# Patient Record
Sex: Female | Born: 1965 | Race: White | Hispanic: No | Marital: Single | Smoking: Never smoker
Health system: Southern US, Community
[De-identification: ages and names within clinical notes are randomized; demographics above are authoritative.]

## PROBLEM LIST (undated history)

## (undated) DIAGNOSIS — D759 Disease of blood and blood-forming organs, unspecified: Secondary | ICD-10-CM

## (undated) DIAGNOSIS — H5440 Blindness, one eye, unspecified eye: Secondary | ICD-10-CM

## (undated) DIAGNOSIS — R32 Unspecified urinary incontinence: Secondary | ICD-10-CM

## (undated) DIAGNOSIS — F319 Bipolar disorder, unspecified: Secondary | ICD-10-CM

## (undated) DIAGNOSIS — F99 Mental disorder, not otherwise specified: Secondary | ICD-10-CM

## (undated) HISTORY — PX: ABDOMINAL HYSTERECTOMY: SHX81

---

## 1999-02-11 ENCOUNTER — Ambulatory Visit (HOSPITAL_COMMUNITY): Admission: RE | Admit: 1999-02-11 | Discharge: 1999-02-11 | Payer: Self-pay | Admitting: Obstetrics and Gynecology

## 2001-05-17 ENCOUNTER — Ambulatory Visit (HOSPITAL_COMMUNITY): Admission: RE | Admit: 2001-05-17 | Discharge: 2001-05-17 | Payer: Self-pay | Admitting: Obstetrics and Gynecology

## 2003-05-22 ENCOUNTER — Ambulatory Visit (HOSPITAL_COMMUNITY): Admission: RE | Admit: 2003-05-22 | Discharge: 2003-05-22 | Payer: Self-pay | Admitting: Obstetrics and Gynecology

## 2004-07-14 ENCOUNTER — Emergency Department (HOSPITAL_COMMUNITY): Admission: EM | Admit: 2004-07-14 | Discharge: 2004-07-14 | Payer: Self-pay | Admitting: Emergency Medicine

## 2010-10-27 ENCOUNTER — Other Ambulatory Visit (HOSPITAL_COMMUNITY): Payer: Medicaid Other

## 2010-10-28 ENCOUNTER — Encounter (HOSPITAL_COMMUNITY)
Admission: RE | Admit: 2010-10-28 | Discharge: 2010-10-28 | Disposition: A | Discharge status: Home / Self Care | Payer: Medicaid Other | Source: Ambulatory Visit | Attending: Dentistry | Admitting: Dentistry

## 2010-10-28 LAB — CBC
MCH: 32 pg (ref 26.0–34.0)
MCHC: 35.1 g/dL (ref 30.0–36.0)
Platelets: 98 10*3/uL — ABNORMAL LOW (ref 150–400)

## 2010-10-28 LAB — BASIC METABOLIC PANEL
CO2: 31 mEq/L (ref 19–32)
Calcium: 9.4 mg/dL (ref 8.4–10.5)
Creatinine, Ser: 0.64 mg/dL (ref 0.4–1.2)
GFR calc Af Amer: >60 mL/min (ref >60)

## 2010-11-01 ENCOUNTER — Ambulatory Visit (HOSPITAL_COMMUNITY)
Admission: RE | Admit: 2010-11-01 | Discharge: 2010-11-01 | Disposition: A | Discharge status: Home / Self Care | Payer: Medicaid Other | Source: Ambulatory Visit | Attending: Dentistry | Admitting: Dentistry

## 2010-11-01 DIAGNOSIS — Z01812 Encounter for preprocedural laboratory examination: Secondary | ICD-10-CM | POA: Insufficient documentation

## 2010-11-01 DIAGNOSIS — K029 Dental caries, unspecified: Secondary | ICD-10-CM | POA: Insufficient documentation

## 2010-11-17 NOTE — Op Note (Signed)
  Mackenzie Mcdonald, Mackenzie Mcdonald NO.:  [id number]  MEDICAL RECORD NO.:  [id number]  LOCATION:                                 FACILITY:  PHYSICIAN:  Esaw Dace., D.D.S.DATE OF BIRTH:  Oct 17, 1965  DATE OF PROCEDURE: DATE OF DISCHARGE:                              OPERATIVE REPORT   PREOPERATIVE DIAGNOSIS:  Dental caries with behavior management due to intellectual and physical disability.  OPERATION:  Oral rehabilitation including exam, cleaning, and operative.  SURGEON:  Azucena Freed, DDS  ASSISTANT:  Tobi Bastos, DA, and hospital staff.  ANESTHESIA:  General.  Dental care provided in the OR for medically necessary treatment findings for soft tissue including the floor of the mouth, buccal mucosa, soft palate, hard palate, tongue, gingiva, and frenum attachments all within normal limits with regard to size, color, and consistency.  Hard tissue exam reveals missing #1 and 2 with 3 through 8 present, missing #9 and 10, 11 present, 12 missing 13 present, 14 missing, 15 present, 16 missing, 17 and 18 missing, 19 through 28 present, 29 missing, 30 through 32 present.  PROCEDURE:  The patient was brought into the operating room and placed in the supine position.  General anesthesia was administered via nasal intubation.  The patient was prepped and draped in the usual manner for an intraoral general dentistry procedure.  The oropharynx was suctioned and a moistened posterior throat pack was placed.  A full intraoral exam including all hard and soft tissues was performed.  Full mouth series of digital x-rays was taken.  Scaling was completed on the lower right, upper left, and lower left.  Extractions were done on #8, 30, and 32 using 3-0 chromic suture with Gelfoam.  A 6 mL of 2% lidocaine anesthesia was given with a 1:100,000 epinephrine.  The mouth was suctioned dry and posterior throat pack was carefully removed with constant suction.  The estimated  blood loss was 30 mL.  The patient was awakened in the OR and transferred to the recovery room in good condition.  Postoperative prescriptions given, Vicodin 5 mg/325 mg 8 tablets one q.4 hours p.r.n. pain, then amoxicillin 500 mg one t.i.d. x7 days for postage extraction infection.     Esaw Dace., D.D.S.     WEM/MEDQ  D:  11/10/2010  T:  11/10/2010  Job:  [id number]  Electronically Signed by Boneta Lucks D.D.S. on 11/17/2010 08:09:57 AM

## 2011-10-05 ENCOUNTER — Other Ambulatory Visit: Payer: Self-pay | Admitting: Family Medicine

## 2011-10-05 DIAGNOSIS — Z1231 Encounter for screening mammogram for malignant neoplasm of breast: Secondary | ICD-10-CM

## 2011-10-13 ENCOUNTER — Ambulatory Visit: Payer: Medicaid Other

## 2013-03-19 ENCOUNTER — Other Ambulatory Visit: Payer: Self-pay | Admitting: Dentistry

## 2013-03-24 ENCOUNTER — Encounter (HOSPITAL_COMMUNITY): Payer: Self-pay | Admitting: Pharmacy Technician

## 2013-03-25 NOTE — Pre-Procedure Instructions (Signed)
Mackenzie Mcdonald  03/25/2013   Your procedure is scheduled on:  Tuesday, October 28th  Report to Main Entrance "A" and check in with admitting at 1100 AM.  Call this number if you have problems the morning of surgery: [phone]   Remember:   Do not eat food or drink liquids after midnight.   Take these medicines the morning of surgery with A SIP OF WATER: lexapro, zyprexa, topamax   Do not wear jewelry, make-up or nail polish.  Do not wear lotions, powders, or perfumes. You may wear deodorant.  Do not shave 48 hours prior to surgery. Men may shave face and neck.  Do not bring valuables to the hospital.  [facility] is not responsible  for any belongings or valuables.               Contacts, dentures or bridgework may not be worn into surgery.  Leave suitcase in the car. After surgery it may be brought to your room.  For patients admitted to the hospital, discharge time is determined by your treatment team.               Patients discharged the day of surgery will not be allowed to drive home.    Special Instructions: Shower using CHG 2 nights before surgery and the night before surgery.  If you shower the day of surgery use CHG.  Use special wash - you have one bottle of CHG for all showers.  You should use approximately 1/3 of the bottle for each shower.   Please read over the following fact sheets that you were given: Pain Booklet, Coughing and Deep Breathing and Surgical Site Infection Prevention

## 2013-03-26 ENCOUNTER — Encounter (HOSPITAL_COMMUNITY)
Admission: RE | Admit: 2013-03-26 | Discharge: 2013-03-26 | Disposition: A | Discharge status: Home / Self Care | Payer: Medicaid Other | Source: Ambulatory Visit | Attending: Dentistry | Admitting: Dentistry

## 2013-03-26 ENCOUNTER — Encounter (HOSPITAL_COMMUNITY): Payer: Self-pay

## 2013-03-26 DIAGNOSIS — Z01818 Encounter for other preprocedural examination: Secondary | ICD-10-CM | POA: Insufficient documentation

## 2013-03-26 DIAGNOSIS — Z01812 Encounter for preprocedural laboratory examination: Secondary | ICD-10-CM | POA: Insufficient documentation

## 2013-03-26 HISTORY — DX: Disease of blood and blood-forming organs, unspecified: D75.9

## 2013-03-26 HISTORY — DX: Blindness, one eye, unspecified eye: H54.40

## 2013-03-26 HISTORY — DX: Mental disorder, not otherwise specified: F99

## 2013-03-26 HISTORY — DX: Unspecified urinary incontinence: R32

## 2013-03-26 HISTORY — DX: Bipolar disorder, unspecified: F31.9

## 2013-03-26 LAB — CBC
MCH: 30.6 pg (ref 26.0–34.0)
Platelets: 131 10*3/uL — ABNORMAL LOW (ref 150–400)
RBC: 4.54 MIL/uL (ref 3.87–5.11)
RDW: 12.3 % (ref 11.5–15.5)
WBC: 5.8 10*3/uL (ref 4.0–10.5)

## 2013-03-26 NOTE — Progress Notes (Signed)
Guardianship papers received and placed in chart. Consent faxed back to Genia Harold to be witnessed.

## 2013-03-26 NOTE — Progress Notes (Signed)
Guardianship papers to be faxed by R. Roxan Hockey [phone].  Consent on chart signed by legal guardian.

## 2013-03-27 NOTE — Progress Notes (Signed)
Anesthesia Chart Review:  Patient is a 47 year old female scheduled for xrays, dental restoration, extractions on 04/01/13 by Dr. Martie Round.  History includes Bipolar 1 disorder, autism, blindness in left eye, urinary incontinence, thrombocytopenia, hysterectomy, prior dental surgery 11/01/10 [facility]).  Her guardianship is with the [facility] DSS.  Preoperative CBC showed a PLT count of 131K, H/H WNL.  H&P and guardianship papers are in her chart.  Anticipate that she can proceed as planned.  Velna Ochs [facility] Short Stay Center/Anesthesiology Phone [phone] 03/27/2013 12:09 PM

## 2013-03-27 NOTE — Progress Notes (Signed)
Left message for Gerda Diss at [facility] for clarification on the signature  For the consent of surgery  Compared to the state of [employer] [employer] [place] [employer].

## 2013-04-01 ENCOUNTER — Encounter (HOSPITAL_COMMUNITY): Payer: Self-pay | Admitting: Anesthesiology

## 2013-04-01 ENCOUNTER — Ambulatory Visit (HOSPITAL_COMMUNITY): Payer: Medicaid Other | Admitting: Anesthesiology

## 2013-04-01 ENCOUNTER — Encounter (HOSPITAL_COMMUNITY): Payer: Medicaid Other | Admitting: Vascular Surgery

## 2013-04-01 ENCOUNTER — Encounter (HOSPITAL_COMMUNITY)
Admission: RE | Disposition: A | Discharge status: Nursing Home (Includes ICF) | Payer: Self-pay | Source: Ambulatory Visit | Attending: Dentistry

## 2013-04-01 ENCOUNTER — Ambulatory Visit (HOSPITAL_COMMUNITY)
Admission: RE | Admit: 2013-04-01 | Discharge: 2013-04-01 | Disposition: A | Discharge status: Other Acute Inpatient Hospital | Payer: Medicaid Other | Source: Ambulatory Visit | Attending: Dentistry | Admitting: Dentistry

## 2013-04-01 DIAGNOSIS — D696 Thrombocytopenia, unspecified: Secondary | ICD-10-CM | POA: Insufficient documentation

## 2013-04-01 DIAGNOSIS — K029 Dental caries, unspecified: Secondary | ICD-10-CM | POA: Insufficient documentation

## 2013-04-01 DIAGNOSIS — F79 Unspecified intellectual disabilities: Secondary | ICD-10-CM | POA: Insufficient documentation

## 2013-04-01 DIAGNOSIS — Z79899 Other long term (current) drug therapy: Secondary | ICD-10-CM | POA: Insufficient documentation

## 2013-04-01 DIAGNOSIS — F840 Autistic disorder: Secondary | ICD-10-CM | POA: Insufficient documentation

## 2013-04-01 DIAGNOSIS — K0381 Cracked tooth: Secondary | ICD-10-CM | POA: Insufficient documentation

## 2013-04-01 HISTORY — PX: DENTAL RESTORATION/EXTRACTION WITH X-RAY: SHX5796

## 2013-04-01 SURGERY — DENTAL RESTORATION/EXTRACTION WITH X-RAY
Anesthesia: General | Site: Mouth | Wound class: Clean Contaminated

## 2013-04-01 MED ORDER — GLYCOPYRROLATE 0.2 MG/ML IJ SOLN
INTRAMUSCULAR | Status: DC | PRN
  Administered 2013-04-01: 0.2 mg via INTRAVENOUS

## 2013-04-01 MED ORDER — FENTANYL CITRATE 0.05 MG/ML IJ SOLN
INTRAMUSCULAR | Status: DC | PRN
  Administered 2013-04-01: 100 ug via INTRAVENOUS
  Administered 2013-04-01 (×3): 50 ug via INTRAVENOUS

## 2013-04-01 MED ORDER — LACTATED RINGERS IV SOLN
INTRAVENOUS | Status: DC | PRN
  Administered 2013-04-01: 12:00:00 via INTRAVENOUS

## 2013-04-01 MED ORDER — LACTATED RINGERS IV SOLN
INTRAVENOUS | Status: DC
  Administered 2013-04-01: 11:00:00 via INTRAVENOUS

## 2013-04-01 MED ORDER — PROPOFOL 10 MG/ML IV BOLUS
INTRAVENOUS | Status: DC | PRN
  Administered 2013-04-01: 170 mg via INTRAVENOUS

## 2013-04-01 MED ORDER — ONDANSETRON HCL 4 MG/2ML IJ SOLN
INTRAMUSCULAR | Status: DC | PRN
  Administered 2013-04-01: 4 mg via INTRAVENOUS

## 2013-04-01 MED ORDER — LIDOCAINE HCL (CARDIAC) 20 MG/ML IV SOLN
INTRAVENOUS | Status: DC | PRN
  Administered 2013-04-01: 60 mg via INTRAVENOUS

## 2013-04-01 MED ORDER — LIDOCAINE HCL 2 % IJ SOLN
INTRAMUSCULAR | Status: DC | PRN
  Administered 2013-04-01: 20 mL

## 2013-04-01 MED ORDER — SUCCINYLCHOLINE CHLORIDE 20 MG/ML IJ SOLN
INTRAMUSCULAR | Status: DC | PRN
  Administered 2013-04-01: 100 mg via INTRAVENOUS

## 2013-04-01 MED ORDER — KETOROLAC TROMETHAMINE 30 MG/ML IJ SOLN
INTRAMUSCULAR | Status: DC | PRN
  Administered 2013-04-01: 30 mg via INTRAVENOUS

## 2013-04-01 MED ORDER — LIDOCAINE HCL 2 % IJ SOLN
INTRAMUSCULAR | Status: AC
  Filled 2013-04-01: qty 20

## 2013-04-01 MED ORDER — MIDAZOLAM HCL 5 MG/5ML IJ SOLN
INTRAMUSCULAR | Status: DC | PRN
  Administered 2013-04-01: 2 mg via INTRAVENOUS

## 2013-04-01 SURGICAL SUPPLY — 39 items
BLADE SURG 15 STRL LF DISP TIS (BLADE) IMPLANT
BLADE SURG 15 STRL SS (BLADE) ×2
CANISTER SUCTION 2500CC (MISCELLANEOUS) ×2 IMPLANT
CLOTH BEACON ORANGE TIMEOUT ST (SAFETY) ×2 IMPLANT
CONT SPEC 4OZ CLIKSEAL STRL BL (MISCELLANEOUS) IMPLANT
COVER PROBE W GEL 5X96 (DRAPES) ×1 IMPLANT
COVER SURGICAL LIGHT HANDLE (MISCELLANEOUS) ×2 IMPLANT
COVER TABLE BACK 60X90 (DRAPES) ×2 IMPLANT
DECANTER SPIKE VIAL GLASS SM (MISCELLANEOUS) IMPLANT
DRAPE PROXIMA HALF (DRAPES) ×1 IMPLANT
ELECT COATED BLADE 2.86 ST (ELECTRODE) IMPLANT
ELECT REM PT RETURN 9FT ADLT (ELECTROSURGICAL)
ELECTRODE REM PT RTRN 9FT ADLT (ELECTROSURGICAL) IMPLANT
GAUZE PACKING FOLDED 2  STR (GAUZE/BANDAGES/DRESSINGS)
GAUZE PACKING FOLDED 2 STR (GAUZE/BANDAGES/DRESSINGS) IMPLANT
GAUZE SPONGE 2X2 8PLY STRL LF (GAUZE/BANDAGES/DRESSINGS) IMPLANT
GAUZE SPONGE 4X4 16PLY XRAY LF (GAUZE/BANDAGES/DRESSINGS) ×2 IMPLANT
GLOVE BIO SURGEON STRL SZ7.5 (GLOVE) ×2 IMPLANT
GOWN STRL NON-REIN LRG LVL3 (GOWN DISPOSABLE) ×4 IMPLANT
KIT BASIN OR (CUSTOM PROCEDURE TRAY) ×2 IMPLANT
KIT ROOM TURNOVER OR (KITS) ×2 IMPLANT
NDL FILTER BLUNT 18X1 1/2 (NEEDLE) IMPLANT
NEEDLE 27GAX1X1/2 (NEEDLE) IMPLANT
NEEDLE FILTER BLUNT 18X 1/2SAF (NEEDLE)
NEEDLE FILTER BLUNT 18X1 1/2 (NEEDLE) IMPLANT
PAD ARMBOARD 7.5X6 YLW CONV (MISCELLANEOUS) ×4 IMPLANT
PENCIL BUTTON HOLSTER BLD 10FT (ELECTRODE) IMPLANT
SPONGE GAUZE 2X2 STER 10/PKG (GAUZE/BANDAGES/DRESSINGS)
SPONGE GAUZE 4X4 12PLY (GAUZE/BANDAGES/DRESSINGS) IMPLANT
SPONGE SURGIFOAM ABS GEL 12-7 (HEMOSTASIS) ×1 IMPLANT
SPONGE SURGIFOAM ABS GEL SZ50 (HEMOSTASIS) IMPLANT
SUT CHROMIC 3 0 PS 2 (SUTURE) IMPLANT
SYR CONTROL 10ML LL (SYRINGE) ×1 IMPLANT
TOOTHBRUSH ADULT (PERSONAL CARE ITEMS) IMPLANT
TOWEL OR 17X24 6PK STRL BLUE (TOWEL DISPOSABLE) IMPLANT
TOWEL OR 17X26 10 PK STRL BLUE (TOWEL DISPOSABLE) ×2 IMPLANT
TUBE CONNECTING 12X1/4 (SUCTIONS) ×2 IMPLANT
WATER STERILE IRR 1000ML POUR (IV SOLUTION) ×3 IMPLANT
YANKAUER SUCT BULB TIP NO VENT (SUCTIONS) ×2 IMPLANT

## 2013-04-01 NOTE — Op Note (Signed)
Recall Exam, FMX, FMD, Glass ionomer 20,21,22 facials, Extractions 3,13, 6cc 2%Liodacaine 1:100,000 with gel foam and 3-0 chromic suture. 30 mg Toradol

## 2013-04-01 NOTE — Anesthesia Preprocedure Evaluation (Addendum)
Anesthesia Evaluation  Patient identified by MRN, date of birth, ID band Patient awake    Reviewed: Allergy & Precautions, H&P , NPO status , Patient's Chart, lab work & pertinent test results  Airway Mallampati: II TM Distance: >3 FB Neck ROM: full    Dental  (+) Poor Dentition and Dental Advisory Given   Pulmonary neg pulmonary ROS,  breath sounds clear to auscultation  Pulmonary exam normal       Cardiovascular Exercise Tolerance: Good negative cardio ROS  Rhythm:regular Rate:Normal     Neuro/Psych PSYCHIATRIC DISORDERS Bipolar Disorder Profound autism.  Blind left eye negative neurological ROS  negative psych ROS   GI/Hepatic negative GI ROS, Neg liver ROS,   Endo/Other  negative endocrine ROS  Renal/GU negative Renal ROS  negative genitourinary   Musculoskeletal   Abdominal   Peds  Hematology  (+) Blood dyscrasia, , thrombocytopenia   Anesthesia Other Findings   Reproductive/Obstetrics negative OB ROS                          Anesthesia Physical Anesthesia Plan  ASA: III  Anesthesia Plan: General   Post-op Pain Management:    Induction: Intravenous  Airway Management Planned: Nasal ETT  Additional Equipment:   Intra-op Plan:   Post-operative Plan: Extubation in OR  Informed Consent: I have reviewed the patients History and Physical, chart, labs and discussed the procedure including the risks, benefits and alternatives for the proposed anesthesia with the patient or authorized representative who has indicated his/her understanding and acceptance.   Dental Advisory Given  Plan Discussed with: CRNA and Surgeon  Anesthesia Plan Comments:         Anesthesia Quick Evaluation

## 2013-04-01 NOTE — Transfer of Care (Signed)
Immediate Anesthesia Transfer of Care Note  Patient: Mackenzie Mcdonald  Procedure(s) Performed: Procedure(s): CLEANING WITH XRAY, DENTAL RESTORATION AND EXTRACTIONS  (N/A)  Patient Location: PACU  Anesthesia Type:General  Level of Consciousness: awake, alert  and responds to stimulation  Airway & Oxygen Therapy: Patient Spontanous Breathing and Patient connected to face mask oxygen  Post-op Assessment: Report given to PACU RN, Post -op Vital signs reviewed and stable and Patient moving all extremities  Post vital signs: Reviewed and stable  Complications: No apparent anesthesia complications

## 2013-04-01 NOTE — H&P (Signed)
  Approved for surgery.

## 2013-04-01 NOTE — Anesthesia Procedure Notes (Signed)
Procedure Name: Intubation Date/Time: 04/01/2013 12:44 PM Performed by: Ferol Luz L Pre-anesthesia Checklist: Patient identified, Emergency Drugs available, Suction available, Patient being monitored and Timeout performed Patient Re-evaluated:Patient Re-evaluated prior to inductionOxygen Delivery Method: Circle system utilized Preoxygenation: Pre-oxygenation with 100% oxygen Intubation Type: IV induction Laryngoscope Size: Mac and 3 Grade View: Grade II Nasal Tubes: Nasal Rae and Right Tube size: 7.0 mm Number of attempts: 1 Placement Confirmation: ETT inserted through vocal cords under direct vision,  breath sounds checked- equal and bilateral and positive ETCO2 Tube secured with: Tape Dental Injury: Teeth and Oropharynx as per pre-operative assessment  Comments: Pt right nare serially dilated with NPA's 6.5- 7.5. 7.0 nasal rae passed right nare with slight trauma due to snug fit visualized through cords s probs cuff noted to be ruptured adequate TV achieved with throat pack in .

## 2013-04-01 NOTE — Progress Notes (Signed)
Report given to lauren reynolds rn as caregiver 

## 2013-04-01 NOTE — Brief Op Note (Signed)
04/01/2013  2:01 PM  PATIENT:  Mackenzie Mcdonald  47 y.o. female  PRE-OPERATIVE DIAGNOSIS:  DENTAL CARIES  POST-OPERATIVE DIAGNOSIS:  DENTAL CARIES  PROCEDURE:  Procedure(s): CLEANING WITH XRAY, DENTAL RESTORATION AND EXTRACTIONS  (N/A)  SURGEON:  Surgeon(s) and Role:    * Esaw Dace., DDS - Primary  PHYSICIAN ASSISTANT:   ASSISTANTS: Laqueta Carina   ANESTHESIA:   general  EBL: 30cc    BLOOD ADMINISTERED:none  DRAINS: none   LOCAL MEDICATIONS USED:  LIDOCAINE   SPECIMEN:  No Specimen  DISPOSITION OF SPECIMEN:  N/A  COUNTS:  YES  TOURNIQUET:  * No tourniquets in log *  DICTATION: .Note written in EPIC  PLAN OF CARE: Discharge to home after PACU  PATIENT DISPOSITION:  PACU - hemodynamically stable.   Delay start of Pharmacological VTE agent (>24hrs) due to surgical blood loss or risk of bleeding: not applicable

## 2013-04-02 ENCOUNTER — Encounter (HOSPITAL_COMMUNITY): Payer: Self-pay | Admitting: Dentistry

## 2013-04-02 NOTE — Anesthesia Postprocedure Evaluation (Signed)
  Anesthesia Post-op Note  Patient: Mackenzie Mcdonald  Procedure(s) Performed: Procedure(s): CLEANING WITH XRAY, DENTAL RESTORATION AND EXTRACTIONS  (N/A)  Patient Location: PACU  Anesthesia Type:General  Level of Consciousness: awake   Airway and Oxygen Therapy: Patient Spontanous Breathing  Post-op Pain: none  Post-op Assessment: Post-op Vital signs reviewed, Patient's Cardiovascular Status Stable and Respiratory Function Stable  Post-op Vital Signs: Reviewed  Filed Vitals:   04/01/13 1445  BP: 127/89  Pulse:   Temp: 36.1 C  Resp:     Complications: No apparent anesthesia complications

## 2013-04-03 NOTE — Op Note (Signed)
NAMESURIE, SUCHOCKI NO.:  [id number]  MEDICAL RECORD NO.:  [id number]  LOCATION:                               FACILITY:  [facility]  PHYSICIAN:  Esaw Dace., D.D.S.DATE OF BIRTH:  1965-08-26  DATE OF PROCEDURE:  04/02/2013 DATE OF DISCHARGE:  04/01/2013                              OPERATIVE REPORT   PREOPERATIVE DIAGNOSIS:  Dental caries/behavior management issues due to intellectual/developmental disabilities.  Dental care provided in the OR for medically necessary treatment.  OPERATION:  Full mouth oral rehabilitation including exam, x-rays, cleaning, extractions and operative.  SURGEON:  Azucena Freed, D.D.S.  ASSISTANT:  Laqueta Carina and hospital staff.  ANESTHESIA:  General.  PROCEDURE IN DETAIL:  The patient was brought into the operating room and placed in supine position.  General anesthesia was administered via nasal intubation.  The patient was prepped and draped in the usual manner for an intraoral general dentistry procedure.  Oropharynx was suctioned and was moistened.  Posterior throat pack was placed.  A full intraoral exam including all hard and soft tissues was performed.  This was a recall exam.  Soft tissue exam reveals the floor of the mouth, buccal mucosa, soft palate, hard palate, tongue, gingival and frenum attachments all within normal limits with regard to size, color, and consistency.  Hard tissue exam reveals tooth #1 and 2 missing, class 3 mobility, 4 through 7 present, 8 and 9 missing, 10 and 11 present, 12 missing, 13 fractured tooth, 14 missing, 15 present, 16, 17, and 18 missing, 19 through 28 present, 29 missing, 30 present, 31 and 32 missing.  A full mouth series of digital x-rays was taken.  Operative care was provided with a high-speed handpiece with #557 and #4 bur __________ were completed on #20 F, 21 F, 22 F.  Amalgam was completed on #19 F.  Extractions were completed on 3 and 13th.  Gelfoam  was placed into the extraction sites and 3-0 chromic suture was used to close the size.  A 6 mL of 2% lidocaine with 1:100,000 epinephrine was given.  ESTIMATED BLOOD LOSS:  30 mL.  The mouth was suctioned dry and a posterior throat pack was carefully removed with constant suction.  The patient was awakened in the OR and transferred to the recovery room in good condition.  A 30 mg of Toradol was given at the end of the procedure for a postop pain.  An extraction sheet was given to the patient representative.     Esaw Dace., D.D.S.   ______________________________ Esaw Dace., D.D.S.    WEM/MEDQ  D:  04/02/2013  T:  04/03/2013  Job:  [id number]

## 2014-09-29 ENCOUNTER — Other Ambulatory Visit: Payer: Self-pay

## 2014-09-29 DIAGNOSIS — Z1231 Encounter for screening mammogram for malignant neoplasm of breast: Secondary | ICD-10-CM

## 2014-10-06 ENCOUNTER — Ambulatory Visit: Payer: Medicaid Other

## 2014-10-12 ENCOUNTER — Ambulatory Visit
Admission: RE | Admit: 2014-10-12 | Discharge: 2014-10-12 | Disposition: A | Discharge status: Home / Self Care | Payer: Medicaid Other | Source: Ambulatory Visit

## 2014-10-12 DIAGNOSIS — Z1231 Encounter for screening mammogram for malignant neoplasm of breast: Secondary | ICD-10-CM

## 2014-11-09 ENCOUNTER — Other Ambulatory Visit: Payer: Self-pay | Admitting: Family Medicine

## 2014-11-09 DIAGNOSIS — Z1231 Encounter for screening mammogram for malignant neoplasm of breast: Secondary | ICD-10-CM

## 2014-11-16 ENCOUNTER — Ambulatory Visit
Admission: RE | Admit: 2014-11-16 | Discharge: 2014-11-16 | Disposition: A | Discharge status: Home / Self Care | Payer: Medicaid Other | Source: Ambulatory Visit | Attending: Family Medicine | Admitting: Family Medicine

## 2014-11-16 DIAGNOSIS — Z1231 Encounter for screening mammogram for malignant neoplasm of breast: Secondary | ICD-10-CM

## 2015-02-05 ENCOUNTER — Emergency Department (HOSPITAL_COMMUNITY)
Admission: EM | Admit: 2015-02-05 | Discharge: 2015-02-05 | Disposition: A | Discharge status: Home / Self Care | Payer: Medicaid Other | Attending: Emergency Medicine | Admitting: Emergency Medicine

## 2015-02-05 ENCOUNTER — Encounter (HOSPITAL_COMMUNITY): Payer: Self-pay | Admitting: *Deleted

## 2015-02-05 DIAGNOSIS — F840 Autistic disorder: Secondary | ICD-10-CM | POA: Insufficient documentation

## 2015-02-05 DIAGNOSIS — Y998 Other external cause status: Secondary | ICD-10-CM | POA: Diagnosis not present

## 2015-02-05 DIAGNOSIS — F319 Bipolar disorder, unspecified: Secondary | ICD-10-CM | POA: Insufficient documentation

## 2015-02-05 DIAGNOSIS — Y9289 Other specified places as the place of occurrence of the external cause: Secondary | ICD-10-CM | POA: Diagnosis not present

## 2015-02-05 DIAGNOSIS — S0033XA Contusion of nose, initial encounter: Secondary | ICD-10-CM | POA: Diagnosis not present

## 2015-02-05 DIAGNOSIS — S7001XA Contusion of right hip, initial encounter: Secondary | ICD-10-CM | POA: Insufficient documentation

## 2015-02-05 DIAGNOSIS — Z862 Personal history of diseases of the blood and blood-forming organs and certain disorders involving the immune mechanism: Secondary | ICD-10-CM | POA: Insufficient documentation

## 2015-02-05 DIAGNOSIS — S40011A Contusion of right shoulder, initial encounter: Secondary | ICD-10-CM | POA: Insufficient documentation

## 2015-02-05 DIAGNOSIS — H5442 Blindness, left eye, normal vision right eye: Secondary | ICD-10-CM | POA: Diagnosis not present

## 2015-02-05 DIAGNOSIS — W010XXA Fall on same level from slipping, tripping and stumbling without subsequent striking against object, initial encounter: Secondary | ICD-10-CM | POA: Insufficient documentation

## 2015-02-05 DIAGNOSIS — Z79899 Other long term (current) drug therapy: Secondary | ICD-10-CM | POA: Insufficient documentation

## 2015-02-05 DIAGNOSIS — S40021A Contusion of right upper arm, initial encounter: Secondary | ICD-10-CM | POA: Diagnosis not present

## 2015-02-05 DIAGNOSIS — Z23 Encounter for immunization: Secondary | ICD-10-CM | POA: Diagnosis not present

## 2015-02-05 DIAGNOSIS — S0993XA Unspecified injury of face, initial encounter: Secondary | ICD-10-CM | POA: Diagnosis present

## 2015-02-05 DIAGNOSIS — S40022A Contusion of left upper arm, initial encounter: Secondary | ICD-10-CM | POA: Diagnosis not present

## 2015-02-05 DIAGNOSIS — S0083XA Contusion of other part of head, initial encounter: Secondary | ICD-10-CM | POA: Insufficient documentation

## 2015-02-05 DIAGNOSIS — Y9389 Activity, other specified: Secondary | ICD-10-CM | POA: Insufficient documentation

## 2015-02-05 DIAGNOSIS — W19XXXA Unspecified fall, initial encounter: Secondary | ICD-10-CM

## 2015-02-05 MED ORDER — TETANUS-DIPHTH-ACELL PERTUSSIS 5-2.5-18.5 LF-MCG/0.5 IM SUSP
0.5000 mL | Freq: Once | INTRAMUSCULAR | Status: AC
  Administered 2015-02-05: 0.5 mL via INTRAMUSCULAR
  Filled 2015-02-05: qty 0.5

## 2015-02-05 NOTE — ED Notes (Signed)
Pt lives in group home. Reports tripping and falling, hit nose on floor and nose was bleeding. Denies loc. No distress noted.

## 2015-02-05 NOTE — ED Provider Notes (Signed)
CSN: [id number]     Arrival date & time 02/05/15  1048 History   First MD Initiated Contact with Patient 02/05/15 1106     Chief Complaint  Patient presents with  . Fall     (Consider location/radiation/quality/duration/timing/severity/associated sxs/prior Treatment) HPI Mackenzie Mcdonald is a 49 year old female with developmental delay reportedly secondary to autism who lives in a group home. History is provided by her caregiver says she is unable to give history herself. They report that she has frequent falls. She had a witnessed trip and fall this morning and struck her nose on the floor. She had bleeding from the left nares. She was able to get up with assistance and walk at her baseline afterwards. Per her caregiver she has normal for her currently. She has had multiple falls for an extended period of time. She has multiple bruises. He brought her in for their protocol for assessment at the facility. Caregiver does not know who the next kin is. Per the record that that with her last tetanus shot was in February 2006. Past Medical History  Diagnosis Date  . Mental disorder     autism  profound  . Blood dyscrasia     thrombocytopenia  . Bipolar 1 disorder   . 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 Urinary incontinence    Past Surgical History  Procedure Laterality Date  . Abdominal hysterectomy    . Dental restoration/extraction with x-Kamil Hanigan N/A 04/01/2013    Procedure: CLEANING WITH XRAY, DENTAL RESTORATION AND EXTRACTIONS ;  Surgeon: Esaw Dace., DDS;  Location: [facility] OR;  Service: Oral Surgery;  Laterality: N/A;   History reviewed. No pertinent family history. Social History  Substance Use Topics  . Smoking status: Never Smoker   . Smokeless tobacco: None  . Alcohol Use: No   OB History    No data available     Review of Systems  Unable to perform ROS     Allergies  Bee venom; Benadryl; Haldol; Lithium; Other; Phenothiazines; and Tegretol  Home Medications    Prior to Admission medications   Medication Sig Start Date End Date Taking? Authorizing Provider  chlorhexidine (PERIDEX) 0.12 % solution Use as directed 5 mLs in the mouth or throat every evening. Brush 5 cc of solution on teeth and gums with a toothbrush after pm mouth care.    Historical Provider, MD  Cholecalciferol (VITAMIN D3) 2000 UNITS TABS Take 4,000 Units by mouth daily.     Historical Provider, MD  EPINEPHrine (EPIPEN) 0.3 mg/0.3 mL SOAJ injection Inject 0.3 mg into the muscle as needed (for allergic reaction to bee stings).    Historical Provider, MD  escitalopram (LEXAPRO) 20 MG tablet Take 20 mg by mouth daily.    Historical Provider, MD  mupirocin ointment (BACTROBAN) 2 % Apply 1 application topically 3 (three) times daily. Apply to picks on arms and legs    Historical Provider, MD  OLANZapine (ZYPREXA) 2.5 MG tablet Take 2.5 mg by mouth 2 (two) times daily.    Historical Provider, MD  topiramate (TOPAMAX) 25 MG capsule Take 50 mg by mouth 2 (two) times daily.    Historical Provider, MD   BP 120/68 mmHg  Pulse 97  Resp 18  SpO2 98% Physical Exam  Constitutional: She appears well-nourished.  Appearance consistent with developmental delay.  HENT:  Head: Normocephalic.  Old contusion left mandibular area erythema right forehead dried blood and nares bilaterally. Speculum exam of nares reveals  no active bleeding or septal hematoma. Mouth reveals poor dentition she will not open her mouth for further exam.   Eyes: Conjunctivae and EOM are normal. Pupils are equal, round, and reactive to light.  Neck: Normal range of motion. Neck supple.  No palpable tenderness or step-off over cervical spine.  Cardiovascular: Normal rate.   Pulmonary/Chest: Effort normal.  Abdominal: Soft.  Musculoskeletal:  Contusion right hip. Full active range of motion of bilateral hips. Several contusions bilateral arms. She has a contusion that is red over her right shoulder. Full active range of motion  of both shoulders. A palpable tenderness over thoracic or lumbar spine.  Neurological: She is alert.  Patient is nonverbal and is at baseline per her caregiver.  Skin: No rash noted. There is erythema.  Nursing note and vitals reviewed.   ED Course  Procedures (including critical care time) Labs Review Labs Reviewed - No data to display  Imaging Review No results found. I have personally reviewed and evaluated these images and lab results as part of my medical decision-making.   EKG Interpretation None      MDM   Final diagnoses:  Facial contusion, initial encounter  Nasal contusion, initial encounter  Fall, initial encounter    Developmentally delayed 49 year old female who had a mechanical fall. No report of loss of consciousness, change in mental status, or lateralized deficits. Caregivers are given return precautions for head injury. Patient has had her tetanus updated here in the emergency department and is discharged to home.    Margarita Grizzle, MD 02/06/15 [phone]

## 2015-02-05 NOTE — Discharge Instructions (Signed)
Head Injury °You have received a head injury. It does not appear serious at this time. Headaches and vomiting are common following head injury. It should be easy to awaken from sleeping. Sometimes it is necessary for you to stay in the emergency department for a while for observation. Sometimes admission to the hospital may be needed. After injuries such as yours, most problems occur within the first 24 hours, but side effects may occur up to 7-10 days after the injury. It is important for you to carefully monitor your condition and contact your health care provider or seek immediate medical care if there is a change in your condition. °WHAT ARE THE TYPES OF HEAD INJURIES? °Head injuries can be as minor as a bump. Some head injuries can be more severe. More severe head injuries include: °· A jarring injury to the brain (concussion). °· A bruise of the brain (contusion). This mean there is bleeding in the brain that can cause swelling. °· A cracked skull (skull fracture). °· Bleeding in the brain that collects, clots, and forms a bump (hematoma). °WHAT CAUSES A HEAD INJURY? °A serious head injury is most likely to happen to someone who is in a car wreck and is not wearing a seat belt. Other causes of major head injuries include bicycle or motorcycle accidents, sports injuries, and falls. °HOW ARE HEAD INJURIES DIAGNOSED? °A complete history of the event leading to the injury and your current symptoms will be helpful in diagnosing head injuries. Many times, pictures of the brain, such as CT or MRI are needed to see the extent of the injury. Often, an overnight hospital stay is necessary for observation.  °WHEN SHOULD I SEEK IMMEDIATE MEDICAL CARE?  °You should get help right away if: °· You have confusion or drowsiness. °· You feel sick to your stomach (nauseous) or have continued, forceful vomiting. °· You have dizziness or unsteadiness that is getting worse. °· You have severe, continued headaches not relieved by  medicine. Only take over-the-counter or prescription medicines for pain, fever, or discomfort as directed by your health care provider. °· You do not have normal function of the arms or legs or are unable to walk. °· You notice changes in the black spots in the center of the colored part of your eye (pupil). °· You have a clear or bloody fluid coming from your nose or ears. °· You have a loss of vision. °During the next 24 hours after the injury, you must stay with someone who can watch you for the warning signs. This person should contact local emergency services (911 in the [place] if you have seizures, you become unconscious, or you are unable to wake up. °HOW CAN I PREVENT A HEAD INJURY IN THE FUTURE? °The most important factor for preventing major head injuries is avoiding motor vehicle accidents.  To minimize the potential for damage to your head, it is crucial to wear seat belts while riding in motor vehicles. Wearing helmets while bike riding and playing collision sports (like football) is also helpful. Also, avoiding dangerous activities around the house will further help reduce your risk of head injury.  °WHEN CAN I RETURN TO NORMAL ACTIVITIES AND ATHLETICS? °You should be reevaluated by your health care provider before returning to these activities. If you have any of the following symptoms, you should not return to activities or contact sports until 1 week after the symptoms have stopped: °· Persistent headache. °· Dizziness or vertigo. °· Poor attention and concentration. °· Confusion. °·   Memory problems. °· Nausea or vomiting. °· Fatigue or tire easily. °· Irritability. °· Intolerant of bright lights or loud noises. °· Anxiety or depression. °· Disturbed sleep. °MAKE SURE YOU:  °· Understand these instructions. °· Will watch your condition. °· Will get help right away if you are not doing well or get worse. °Document Released: 05/22/2005 Document Revised: 05/27/2013 Document Reviewed:  01/27/2013 °ExitCare® Patient Information ©2015 [employer]. This information is not intended to replace advice given to you by your health care provider. Make sure you discuss any questions you have with your health care provider. °Contusion °A contusion is a deep bruise. Contusions are the result of an injury that caused bleeding under the skin. The contusion may turn blue, purple, or yellow. Minor injuries will give you a painless contusion, but more severe contusions may stay painful and swollen for a few weeks.  °CAUSES  °A contusion is usually caused by a blow, trauma, or direct force to an area of the body. °SYMPTOMS  °· Swelling and redness of the injured area. °· Bruising of the injured area. °· Tenderness and soreness of the injured area. °· Pain. °DIAGNOSIS  °The diagnosis can be made by taking a history and physical exam. An X-Jahquan Klugh, CT scan, or MRI may be needed to determine if there were any associated injuries, such as fractures. °TREATMENT  °Specific treatment will depend on what area of the body was injured. In general, the best treatment for a contusion is resting, icing, elevating, and applying cold compresses to the injured area. Over-the-counter medicines may also be recommended for pain control. Ask your caregiver what the best treatment is for your contusion. °HOME CARE INSTRUCTIONS  °· Put ice on the injured area. °¨ Put ice in a plastic bag. °¨ Place a towel between your skin and the bag. °¨ Leave the ice on for 15-20 minutes, 3-4 times a day, or as directed by your health care provider. °· Only take over-the-counter or prescription medicines for pain, discomfort, or fever as directed by your caregiver. Your caregiver may recommend avoiding anti-inflammatory medicines (aspirin, ibuprofen, and naproxen) for 48 hours because these medicines may increase bruising. °· Rest the injured area. °· If possible, elevate the injured area to reduce swelling. °SEEK IMMEDIATE MEDICAL CARE IF:  °· You have  increased bruising or swelling. °· You have pain that is getting worse. °· Your swelling or pain is not relieved with medicines. °MAKE SURE YOU:  °· Understand these instructions. °· Will watch your condition. °· Will get help right away if you are not doing well or get worse. °Document Released: 03/01/2005 Document Revised: 05/27/2013 Document Reviewed: 03/27/2011 °ExitCare® Patient Information ©2015 [employer]. This information is not intended to replace advice given to you by your health care provider. Make sure you discuss any questions you have with your health care provider. ° °

## 2015-03-25 ENCOUNTER — Emergency Department (HOSPITAL_COMMUNITY): Payer: Medicaid Other

## 2015-03-25 ENCOUNTER — Inpatient Hospital Stay (HOSPITAL_COMMUNITY): Payer: Medicaid Other

## 2015-03-25 ENCOUNTER — Inpatient Hospital Stay (HOSPITAL_COMMUNITY)
Admission: EM | Admit: 2015-03-25 | Discharge: 2015-04-06 | DRG: 207 | Disposition: E | Payer: Medicaid Other | Attending: Internal Medicine | Admitting: Internal Medicine

## 2015-03-25 DIAGNOSIS — R570 Cardiogenic shock: Secondary | ICD-10-CM | POA: Diagnosis present

## 2015-03-25 DIAGNOSIS — T17920A Food in respiratory tract, part unspecified causing asphyxiation, initial encounter: Secondary | ICD-10-CM | POA: Diagnosis present

## 2015-03-25 DIAGNOSIS — I959 Hypotension, unspecified: Secondary | ICD-10-CM | POA: Diagnosis present

## 2015-03-25 DIAGNOSIS — R4020 Unspecified coma: Secondary | ICD-10-CM | POA: Diagnosis present

## 2015-03-25 DIAGNOSIS — N179 Acute kidney failure, unspecified: Secondary | ICD-10-CM | POA: Diagnosis present

## 2015-03-25 DIAGNOSIS — D696 Thrombocytopenia, unspecified: Secondary | ICD-10-CM | POA: Diagnosis present

## 2015-03-25 DIAGNOSIS — G931 Anoxic brain damage, not elsewhere classified: Secondary | ICD-10-CM | POA: Diagnosis present

## 2015-03-25 DIAGNOSIS — G9341 Metabolic encephalopathy: Secondary | ICD-10-CM | POA: Diagnosis present

## 2015-03-25 DIAGNOSIS — Z452 Encounter for adjustment and management of vascular access device: Secondary | ICD-10-CM | POA: Insufficient documentation

## 2015-03-25 DIAGNOSIS — E871 Hypo-osmolality and hyponatremia: Secondary | ICD-10-CM | POA: Diagnosis present

## 2015-03-25 DIAGNOSIS — E872 Acidosis: Secondary | ICD-10-CM | POA: Diagnosis present

## 2015-03-25 DIAGNOSIS — H5442 Blindness, left eye, normal vision right eye: Secondary | ICD-10-CM | POA: Diagnosis present

## 2015-03-25 DIAGNOSIS — J9600 Acute respiratory failure, unspecified whether with hypoxia or hypercapnia: Secondary | ICD-10-CM

## 2015-03-25 DIAGNOSIS — R32 Unspecified urinary incontinence: Secondary | ICD-10-CM | POA: Diagnosis present

## 2015-03-25 DIAGNOSIS — Z66 Do not resuscitate: Secondary | ICD-10-CM | POA: Diagnosis present

## 2015-03-25 DIAGNOSIS — F840 Autistic disorder: Secondary | ICD-10-CM | POA: Diagnosis present

## 2015-03-25 DIAGNOSIS — Z9289 Personal history of other medical treatment: Secondary | ICD-10-CM

## 2015-03-25 DIAGNOSIS — J690 Pneumonitis due to inhalation of food and vomit: Secondary | ICD-10-CM | POA: Diagnosis present

## 2015-03-25 DIAGNOSIS — Z6833 Body mass index (BMI) 33.0-33.9, adult: Secondary | ICD-10-CM

## 2015-03-25 DIAGNOSIS — Z515 Encounter for palliative care: Secondary | ICD-10-CM | POA: Diagnosis present

## 2015-03-25 DIAGNOSIS — D649 Anemia, unspecified: Secondary | ICD-10-CM | POA: Diagnosis present

## 2015-03-25 DIAGNOSIS — F79 Unspecified intellectual disabilities: Secondary | ICD-10-CM | POA: Diagnosis present

## 2015-03-25 DIAGNOSIS — E8729 Other acidosis: Secondary | ICD-10-CM | POA: Insufficient documentation

## 2015-03-25 DIAGNOSIS — G40909 Epilepsy, unspecified, not intractable, without status epilepticus: Secondary | ICD-10-CM | POA: Diagnosis present

## 2015-03-25 DIAGNOSIS — Z9911 Dependence on respirator [ventilator] status: Secondary | ICD-10-CM | POA: Diagnosis not present

## 2015-03-25 DIAGNOSIS — J9601 Acute respiratory failure with hypoxia: Secondary | ICD-10-CM | POA: Diagnosis present

## 2015-03-25 DIAGNOSIS — R739 Hyperglycemia, unspecified: Secondary | ICD-10-CM | POA: Diagnosis present

## 2015-03-25 DIAGNOSIS — E876 Hypokalemia: Secondary | ICD-10-CM | POA: Diagnosis present

## 2015-03-25 DIAGNOSIS — I469 Cardiac arrest, cause unspecified: Secondary | ICD-10-CM | POA: Diagnosis present

## 2015-03-25 DIAGNOSIS — F319 Bipolar disorder, unspecified: Secondary | ICD-10-CM | POA: Diagnosis present

## 2015-03-25 DIAGNOSIS — E669 Obesity, unspecified: Secondary | ICD-10-CM | POA: Diagnosis present

## 2015-03-25 LAB — CBC WITH DIFFERENTIAL/PLATELET
BASOS PCT: 0 %
Basophils Absolute: 0 10*3/uL (ref 0.0–0.1)
EOS PCT: 1 %
Eosinophils Absolute: 0.2 10*3/uL (ref 0.0–0.7)
HCT: 35.5 % — ABNORMAL LOW (ref 36.0–46.0)
HEMOGLOBIN: 11.8 g/dL — AB (ref 12.0–15.0)
LYMPHS PCT: 28 %
Lymphs Abs: 5.3 10*3/uL — ABNORMAL HIGH (ref 0.7–4.0)
MCH: 31.5 pg (ref 26.0–34.0)
MCHC: 33.2 g/dL (ref 30.0–36.0)
MCV: 94.7 fL (ref 78.0–100.0)
MONOS PCT: 4 %
Monocytes Absolute: 0.8 10*3/uL (ref 0.1–1.0)
NEUTROS ABS: 12.6 10*3/uL — AB (ref 1.7–7.7)
NEUTROS PCT: 67 %
Platelets: 137 10*3/uL — ABNORMAL LOW (ref 150–400)
RBC: 3.75 MIL/uL — ABNORMAL LOW (ref 3.87–5.11)
RDW: 12.8 % (ref 11.5–15.5)
WBC: 18.9 10*3/uL — ABNORMAL HIGH (ref 4.0–10.5)

## 2015-03-25 LAB — COMPREHENSIVE METABOLIC PANEL
ALBUMIN: 2.8 g/dL — AB (ref 3.5–5.0)
ALK PHOS: 97 U/L (ref 38–126)
ALT: 244 U/L — ABNORMAL HIGH (ref 14–54)
ANION GAP: 14 (ref 5–15)
AST: 194 U/L — ABNORMAL HIGH (ref 15–41)
BUN: 15 mg/dL (ref 6–20)
CALCIUM: 7.4 mg/dL — AB (ref 8.9–10.3)
CHLORIDE: 109 mmol/L (ref 101–111)
CO2: 15 mmol/L — AB (ref 22–32)
Creatinine, Ser: 1.2 mg/dL — ABNORMAL HIGH (ref 0.44–1.00)
GFR calc non Af Amer: 53 mL/min — ABNORMAL LOW (ref >60)
GLUCOSE: 264 mg/dL — AB (ref 65–99)
Potassium: 3.3 mmol/L — ABNORMAL LOW (ref 3.5–5.1)
SODIUM: 138 mmol/L (ref 135–145)
Total Bilirubin: 0.2 mg/dL — ABNORMAL LOW (ref 0.3–1.2)
Total Protein: 4.7 g/dL — ABNORMAL LOW (ref 6.5–8.1)

## 2015-03-25 LAB — I-STAT ARTERIAL BLOOD GAS, ED
ACID-BASE DEFICIT: 12 mmol/L — AB (ref 0.0–2.0)
BICARBONATE: 16.1 meq/L — AB (ref 20.0–24.0)
O2 SAT: 79 %
PCO2 ART: 39 mmHg (ref 35.0–45.0)
PO2 ART: 45 mmHg — AB (ref 80.0–100.0)
Patient temperature: 34
TCO2: 17 mmol/L (ref 0–100)
pH, Arterial: 7.206 — ABNORMAL LOW (ref 7.350–7.450)

## 2015-03-25 LAB — I-STAT TROPONIN, ED: TROPONIN I, POC: 0.03 ng/mL (ref 0.00–0.08)

## 2015-03-25 LAB — URINE MICROSCOPIC-ADD ON

## 2015-03-25 LAB — URINALYSIS, ROUTINE W REFLEX MICROSCOPIC
Bilirubin Urine: NEGATIVE
GLUCOSE, UA: 250 mg/dL — AB
KETONES UR: NEGATIVE mg/dL
LEUKOCYTES UA: NEGATIVE
NITRITE: NEGATIVE
PH: 5 (ref 5.0–8.0)
PROTEIN: 100 mg/dL — AB
Specific Gravity, Urine: 1.017 (ref 1.005–1.030)
Urobilinogen, UA: 1 mg/dL (ref 0.0–1.0)

## 2015-03-25 LAB — MAGNESIUM: MAGNESIUM: 1.8 mg/dL (ref 1.7–2.4)

## 2015-03-25 LAB — PROCALCITONIN: Procalcitonin: <0.1 ng/mL

## 2015-03-25 LAB — TROPONIN I

## 2015-03-25 LAB — I-STAT CG4 LACTIC ACID, ED: Lactic Acid, Venous: 4.78 mmol/L (ref 0.5–2.0)

## 2015-03-25 MED ORDER — PANTOPRAZOLE SODIUM 40 MG IV SOLR
40.0000 mg | Freq: Every day | INTRAVENOUS | Status: DC
  Administered 2015-03-25 – 2015-03-28 (×4): 40 mg via INTRAVENOUS
  Filled 2015-03-25 (×4): qty 40

## 2015-03-25 MED ORDER — SODIUM CHLORIDE 0.9 % IV SOLN
1.0000 g | Freq: Once | INTRAVENOUS | Status: AC
  Administered 2015-03-26: 1 g via INTRAVENOUS
  Filled 2015-03-25: qty 10

## 2015-03-25 MED ORDER — DEXTROSE 5 % IV SOLN
0.0000 ug/min | Freq: Once | INTRAVENOUS | Status: DC
  Filled 2015-03-25: qty 4

## 2015-03-25 MED ORDER — MIDAZOLAM HCL 2 MG/2ML IJ SOLN: 2.0000 mg | INTRAMUSCULAR | Status: DC | PRN

## 2015-03-25 MED ORDER — EPINEPHRINE HCL 0.1 MG/ML IJ SOSY
PREFILLED_SYRINGE | INTRAMUSCULAR | Status: DC | PRN
  Administered 2015-03-25: 1 mg via INTRAVENOUS

## 2015-03-25 MED ORDER — ETOMIDATE 2 MG/ML IV SOLN
INTRAVENOUS | Status: AC | PRN
  Administered 2015-03-25: 30 mg via INTRAVENOUS

## 2015-03-25 MED ORDER — FENTANYL CITRATE (PF) 100 MCG/2ML IJ SOLN: 100.0000 ug | INTRAMUSCULAR | Status: DC | PRN

## 2015-03-25 MED ORDER — PIPERACILLIN-TAZOBACTAM 3.375 G IVPB 30 MIN
3.3750 g | Freq: Once | INTRAVENOUS | Status: AC
  Administered 2015-03-25: 3.375 g via INTRAVENOUS
  Filled 2015-03-25: qty 50

## 2015-03-25 MED ORDER — NOREPINEPHRINE BITARTRATE 1 MG/ML IV SOLN
2.0000 ug/min | INTRAVENOUS | Status: DC
  Administered 2015-03-25: 5 ug/min via INTRAVENOUS
  Administered 2015-03-26: 25 ug/min via INTRAVENOUS
  Filled 2015-03-25 (×2): qty 4

## 2015-03-25 MED ORDER — INSULIN ASPART 100 UNIT/ML ~~LOC~~ SOLN
0.0000 [IU] | SUBCUTANEOUS | Status: DC
  Administered 2015-03-26: 2 [IU] via SUBCUTANEOUS
  Administered 2015-03-26: 5 [IU] via SUBCUTANEOUS
  Administered 2015-03-26: 3 [IU] via SUBCUTANEOUS
  Administered 2015-03-26 – 2015-03-27 (×2): 2 [IU] via SUBCUTANEOUS
  Administered 2015-03-27: 3 [IU] via SUBCUTANEOUS
  Administered 2015-03-27: 2 [IU] via SUBCUTANEOUS
  Administered 2015-03-27: 3 [IU] via SUBCUTANEOUS
  Administered 2015-03-27 (×2): 2 [IU] via SUBCUTANEOUS
  Administered 2015-03-28: 3 [IU] via SUBCUTANEOUS
  Administered 2015-03-28 – 2015-03-30 (×6): 2 [IU] via SUBCUTANEOUS

## 2015-03-25 MED ORDER — HEPARIN SODIUM (PORCINE) 5000 UNIT/ML IJ SOLN
5000.0000 [IU] | Freq: Three times a day (TID) | INTRAMUSCULAR | Status: DC
  Administered 2015-03-25 – 2015-03-30 (×14): 5000 [IU] via SUBCUTANEOUS
  Filled 2015-03-25 (×14): qty 1

## 2015-03-25 MED ORDER — PIPERACILLIN-TAZOBACTAM 3.375 G IVPB: 3.3750 g | Freq: Three times a day (TID) | INTRAVENOUS | Status: DC

## 2015-03-25 MED ORDER — SODIUM CHLORIDE 0.9 % IV BOLUS (SEPSIS)
1000.0000 mL | Freq: Once | INTRAVENOUS | Status: AC
  Administered 2015-03-25: 1000 mL via INTRAVENOUS

## 2015-03-25 MED ORDER — POTASSIUM CHLORIDE 20 MEQ/15ML (10%) PO SOLN
40.0000 meq | Freq: Once | ORAL | Status: AC
  Administered 2015-03-26: 40 meq
  Filled 2015-03-25: qty 30

## 2015-03-25 MED ORDER — FENTANYL CITRATE (PF) 100 MCG/2ML IJ SOLN
100.0000 ug | INTRAMUSCULAR | Status: DC | PRN
  Administered 2015-03-25 – 2015-03-26 (×2): 100 ug via INTRAVENOUS
  Filled 2015-03-25: qty 2

## 2015-03-25 MED ORDER — SODIUM CHLORIDE 0.9 % IV SOLN
INTRAVENOUS | Status: DC
  Administered 2015-03-25 – 2015-03-27 (×3): via INTRAVENOUS

## 2015-03-25 MED ORDER — PANTOPRAZOLE SODIUM 40 MG IV SOLR: 40.0000 mg | Freq: Every day | INTRAVENOUS | Status: DC

## 2015-03-25 MED ORDER — FENTANYL CITRATE (PF) 100 MCG/2ML IJ SOLN
INTRAMUSCULAR | Status: AC
Start: 2015-03-25 — End: 2015-03-26
  Filled 2015-03-25: qty 2

## 2015-03-25 MED ORDER — SODIUM CHLORIDE 0.9 % IV SOLN: 250.0000 mL | INTRAVENOUS | Status: DC | PRN

## 2015-03-25 MED ORDER — PIPERACILLIN-TAZOBACTAM 3.375 G IVPB
3.3750 g | Freq: Three times a day (TID) | INTRAVENOUS | Status: AC
  Administered 2015-03-26 – 2015-03-30 (×13): 3.375 g via INTRAVENOUS
  Filled 2015-03-25 (×15): qty 50

## 2015-03-25 MED ORDER — SODIUM CHLORIDE 0.9 % IV SOLN
INTRAVENOUS | Status: AC | PRN
  Administered 2015-03-25: 1000 mL via INTRAVENOUS

## 2015-03-25 MED ORDER — SUCCINYLCHOLINE CHLORIDE 20 MG/ML IJ SOLN
INTRAMUSCULAR | Status: AC | PRN
  Administered 2015-03-25: 100 mg via INTRAVENOUS

## 2015-03-25 MED ORDER — FENTANYL CITRATE (PF) 100 MCG/2ML IJ SOLN
100.0000 ug | INTRAMUSCULAR | Status: DC | PRN
  Administered 2015-03-26 – 2015-03-29 (×5): 100 ug via INTRAVENOUS
  Filled 2015-03-25 (×5): qty 2

## 2015-03-25 MED ORDER — MIDAZOLAM HCL 2 MG/2ML IJ SOLN
2.0000 mg | INTRAMUSCULAR | Status: DC | PRN
  Administered 2015-03-29 (×2): 2 mg via INTRAVENOUS
  Filled 2015-03-25 (×2): qty 2

## 2015-03-25 NOTE — Progress Notes (Signed)
   03/20/2015 2100  Clinical Encounter Type  Visited With Other (Comment) (Staff of her resident facility)  Visit Type Spiritual support;Other (Comment) (Liaison with staff)  Referral From Nurse  Spiritual Encounters  Spiritual Needs Prayer  Stress Factors  Patient Stress Factors Health changes  Responded to trauma call; watched for family, found two members of staff of facility where patient lives. Escorted them to subwaiting room and found nurse to give them updates. Then took them back to the room and offered prayer.

## 2015-03-25 NOTE — H&P (Signed)
PULMONARY / CRITICAL CARE MEDICINE   Name: Mackenzie Mcdonald MRN: [id number] DOB: [birth year]    ADMISSION DATE:  03/09/2015 CONSULTATION DATE:  04/01/2015  REFERRING MD :  EDP  CHIEF COMPLAINT:  Cardiac Arrest  INITIAL PRESENTATION:  49 y.o. F from group home (due to mental retardation) brought to [facility] ED 10/20 after she suffered PEA arrest from choking on a big piece of steak.  ROSC achieved in roughly 30 minutes.  PCCM called for admission.    STUDIES:  CXR 10/20 >>> ETT in place.  Perihilar heterogenous airspace opacities L > R, worrisome for edema vs aspiration.  SIGNIFICANT EVENTS: 10/20 - admitted after PEA arret.   HISTORY OF PRESENT ILLNESS:  Pt is intubated; therefore, this HPI is obtained from chart review. Mackenzie Mcdonald is a 49 y.o. F with PMH as outlined below including mental retardation per report for which she lives in a group home.  She was at group home evening of 10/20 eating dinner when she choked on a piece of steak and became unresponsive.  Abdominal thrusts were attempted by group home staff but were unsuccessful.  EMS was dispatched and on their arrival, pt was in PEA.  Per EMS, ROSC was achieved in roughly 30 minutes.   EMS reported that they extracted an approximately 3" diameter piece of steak from pt's throat prior to placing a king airway.  In ED, she was intubated and was started on levophed gtt for hypotension.  PCCM was called for admission.   PAST MEDICAL HISTORY :   has a past medical history of Mental disorder; Blood dyscrasia; Bipolar 1 disorder; Blind left eye; and Urinary incontinence.  has past surgical history that includes Abdominal hysterectomy and Dental restoration/extraction with x-ray (N/A, 04/01/2013). Prior to Admission medications   Medication Sig Start Date End Date Taking? Authorizing Provider  chlorhexidine (PERIDEX) 0.12 % solution Use as directed 5 mLs in the mouth or throat every evening. Brush 5 cc of solution on teeth and gums with a  toothbrush after pm mouth care.   Yes Historical Provider, MD  Cholecalciferol (VITAMIN D3) 2000 UNITS TABS Take 4,000 Units by mouth daily.    Yes Historical Provider, MD  clonazePAM (KLONOPIN) 1 MG tablet Take 1 mg by mouth 2 (two) times daily.   Yes Historical Provider, MD  EPINEPHrine (EPIPEN) 0.3 mg/0.3 mL SOAJ injection Inject 0.3 mg into the muscle as needed (for allergic reaction to bee stings).   Yes Historical Provider, MD  escitalopram (LEXAPRO) 20 MG tablet Take 20 mg by mouth daily.   Yes Historical Provider, MD  lamoTRIgine (LAMICTAL) 25 MG tablet Take 50 mg by mouth 2 (two) times daily.   Yes Historical Provider, MD  mupirocin ointment (BACTROBAN) 2 % Apply 1 application topically 3 (three) times daily. Apply to picks on arms and legs   Yes Historical Provider, MD  OLANZapine (ZYPREXA) 10 MG tablet Take 10 mg by mouth 2 (two) times daily.   Yes Historical Provider, MD  topiramate (TOPAMAX) 100 MG tablet Take 100 mg by mouth 2 (two) times daily.   Yes Historical Provider, MD   Allergies  Allergen Reactions  . Bee Venom Anaphylaxis  . Benadryl [Diphenhydramine Hcl] Other (See Comments)    unknown  . Haldol [Haloperidol Lactate] Other (See Comments)    unknown  . Lithium Other (See Comments)    unknown  . Other Other (See Comments)    Anthistamines   . Phenothiazines Other (See Comments)    Unknown   .  Tegretol [Carbamazepine] Other (See Comments)    unknown    FAMILY HISTORY:  No family history on file.  SOCIAL HISTORY:  reports that she has never smoked. She does not have any smokeless tobacco history on file. She reports that she does not drink alcohol or use illicit drugs.  REVIEW OF SYSTEMS:  Unable to obtain as pt is intubated.  SUBJECTIVE:   VITAL SIGNS: Temp:  [92.7 F (33.7 C)] 92.7 F (33.7 C) (10/20 2100) Pulse Rate:  [77-96] 89 (10/20 2100) Resp:  [15-28] 21 (10/20 2100) BP: (72-118)/(34-71) 118/69 mmHg (10/20 2100) SpO2:  [97 %-100 %] 98 % (10/20  2100) FiO2 (%):  [100 %] 100 % (10/20 2015) HEMODYNAMICS:   VENTILATOR SETTINGS: Vent Mode:  [-] PRVC FiO2 (%):  [100 %] 100 % Set Rate:  [16 bmp] 16 bmp Vt Set:  [500 mL] 500 mL PEEP:  [5 cmH20] 5 cmH20 Plateau Pressure:  [28 cmH20] 28 cmH20 INTAKE / OUTPUT: Intake/Output    None     PHYSICAL EXAMINATION: General: Young female, unresponsive. Neuro: Unresponsive despite no sedation.  Absent corneal reflex, gag present. HEENT: [place]/AT. Pupils blown bilaterally, non-responsive. Cardiovascular: RRR, no M/R/G.  Lungs: Respirations even and unlabored.  Coarse L > R. Abdomen: BS x 4, soft, NT/ND.  Musculoskeletal: No gross deformities, no edema.  Skin: Intact, warm, no rashes.  LABS:  CBC No results for input(s): WBC, HGB, HCT, PLT in the last 168 hours. Coag's No results for input(s): APTT, INR in the last 168 hours. BMET No results for input(s): NA, K, CL, CO2, BUN, CREATININE, GLUCOSE in the last 168 hours. Electrolytes No results for input(s): CALCIUM, MG, PHOS in the last 168 hours. Sepsis Markers No results for input(s): LATICACIDVEN, PROCALCITON, O2SATVEN in the last 168 hours. ABG No results for input(s): PHART, PCO2ART, PO2ART in the last 168 hours. Liver Enzymes No results for input(s): AST, ALT, ALKPHOS, BILITOT, ALBUMIN in the last 168 hours. Cardiac Enzymes No results for input(s): TROPONINI, PROBNP in the last 168 hours. Glucose No results for input(s): GLUCAP in the last 168 hours.  Imaging Dg Chest Portable 1 View  2015-04-23  CLINICAL DATA:  Post cardiac arrest. EXAM: PORTABLE CHEST 1 VIEW COMPARISON:  None. FINDINGS: Normal cardiac silhouette and mediastinal contours. Endotracheal tube overlies the tracheal air column with tip at the level of the carina. No pneumothorax. Enteric tube tip and side port project over the stomach. Transcutaneous pacer leads overlie the cardiac apex. Pulmonary vasculature is indistinct. Perihilar heterogeneous airspace  opacities. No supine evidence of pleural effusion or pneumothorax. No acute osseus abnormalities. IMPRESSION: 1. Endotracheal tube tip at the level of the carina. Retraction approximately 2.5 cm is recommended. Otherwise, appropriately positioned support apparatus. No supine evidence of pneumothorax. 2. Perihilar heterogeneous airspace opacities worrisome for alveolar pulmonary edema though aspiration could have a similar appearance. Continued attention on follow-up is recommended. Electronically Signed   By: Simonne Come M.D.   On: 2015/04/23 20:53   Dg Abd Portable 1v  April 23, 2015  CLINICAL DATA:  Orogastric catheter placement EXAM: PORTABLE ABDOMEN - 1 VIEW COMPARISON:  None. FINDINGS: Scattered large and small bowel gas is noted. No free air is seen. An orogastric catheter is noted coiled within the stomach although lies coned off the superior aspect of this abdominal film. It is better visualized on the prior chest x-ray. Degenerative changes of the lumbar spine and hip joints are noted. IMPRESSION: Orogastric catheter within the stomach. Electronically Signed   By: Loraine Leriche  Lukens M.D.   On: 03/09/2015 20:53    ASSESSMENT / PLAN:  CARDIOVASCULAR CVL pending 10/20 >>> A:  S/p PEA arrest after asphyxiating on piece of steak 10/20 - ROSC achieved in ~ 30 minutes. Hypotension post arrest - ? Cardiogenic shock. P:  Levophed as needed for goal MAP > 65. Goal CVP 8 - 12. Trend troponin / lactate. Consider TTE.  PULMONARY OETT 10/20 >>> A: VDRF following PEA arrest after asphyixiation 10/20. Aspiration PNA. P:   Full mechanical support, wean as able. ABG noted - increase PEEP and RT asked to perform recruitment maneuvers. VAP prevention measures. SBT in AM if able. CXR in AM.  RENAL A:   Hypokalemia. AG metabolic acidosis - lactate. AKI. Hypocalcemia - corrects to 8.36. P:   40mEq K per tube. 1g Ca gluconate. NS @ 125. BMP in AM.  GASTROINTESTINAL A:   GI  prophylaxis. Nutrition. P:   SUP: Pantoprazole. NPO.  HEMATOLOGIC A:   Mild anemia. Thrombocytopenia. VTE Prophylaxis. P:  Transfuse for Hgb < 7. Monitor platelet counts. SCD's / Heparin. CBC in AM.  INFECTIOUS A:   Aspiration PNA. P:   BCx2 10/20 > Sputum Cx 10/20 > Abx: Zosyn, start date 10/20, day 1/x. PCT algorithm to limit abx exposure.  ENDOCRINE A:   Hyperglycemia. P:   SSI. Hgb A1c.  NEUROLOGIC A:   Acute metabolic encephalopathy. Hx mental retardation, autism, bipolar disorder, ? Seizure disorder (on meds). P:   Sedation:  Fentanyl PRN / Midazolam PRN. RASS goal: 0 to -1. Daily WUA. Continue outpatient lamotrigine, topiramate. Check lamotrigine, topiramate levels. Consider neuro consult. Hold outpatient clonazepam, escitalopram, olanzapine.   Family updated: RN from group home at bedside.  Pt is a [place]eronautical [id number]  Her CSW is Social research officer, [employer] (phone #: [phone]).  Pt with profound autism / mental disorder and functional status at baseline not great.  She did participate in group activities but required 1+ assistance at minimum.  Due to her history and functional status at baseline combined with ~ 30 minutes downtime (potentiall more), she is not a hypothermia candidate.  Interdisciplinary Family Meeting v Palliative Care Meeting:  Due by: 10/26.  CC time:  40 minutes.   Rutherford Guysahul Zaahir Pickney, [place] - C [facility] Pager: [phone]  or [phone] 03/30/2015, 9:17 PM

## 2015-03-25 NOTE — Code Documentation (Signed)
King airway removed prior to intubation.

## 2015-03-25 NOTE — Progress Notes (Addendum)
ANTIBIOTIC CONSULT NOTE - INITIAL  Pharmacy Consult for Zosyn Indication: aspiration pneumonia  Allergies  Allergen Reactions  . Bee Venom Anaphylaxis  . Benadryl [Diphenhydramine Hcl] Other (See Comments)    unknown  . Haldol [Haloperidol Lactate] Other (See Comments)    unknown  . Lithium Other (See Comments)    unknown  . Other Other (See Comments)    Anthistamines   . Phenothiazines Other (See Comments)    Unknown   . Tegretol [Carbamazepine] Other (See Comments)    unknown    Patient Measurements:    Vital Signs: Temp: 92.7 F (33.7 C) (10/20 2100) BP: 118/69 mmHg (10/20 2100) Pulse Rate: 89 (10/20 2100) Intake/Output from previous day:   Intake/Output from this shift:    Labs: No results for input(s): WBC, HGB, PLT, LABCREA, CREATININE in the last 72 hours. CrCl cannot be calculated (Unknown ideal weight.). No results for input(s): VANCOTROUGH, VANCOPEAK, VANCORANDOM, GENTTROUGH, GENTPEAK, GENTRANDOM, TOBRATROUGH, TOBRAPEAK, TOBRARND, AMIKACINPEAK, AMIKACINTROU, AMIKACIN in the last 72 hours.   Microbiology: No results found for this or any previous visit (from the past 720 hour(s)).  Medical History: Past Medical History  Diagnosis Date  . Mental disorder     autism  profound  . Blood dyscrasia     thrombocytopenia  . Bipolar 1 disorder   . 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 Urinary incontinence     Medications:   (Not in a hospital admission) Assessment: [age] yoF w/ developmental delays residing in a group home who presents to ED after choking during dinner. Abdominal thrusts performed, but did not dislodge food, pt became unresponsive and apneic. Pt had been unconscious for ~15 min when EMS arrived and she was found to be in PEA. Food was dislodged by EMS and ROSC achieved ~30 min post arrest. PMH of bipolar disorder, thrombocytopenia, blind in 1 eye. Pharmacy consulted to dose Zosyn for aspiration.   10/20 Zosyn >>  10/20 Trach  aspirate >>  WBC 18.9, LA 4.78, BMET pending, on hypothermia protocol  Goal of Therapy:  Resolution of infection, clinical improvement  Plan:  Zosyn 3.375 g x1 over 30 min  Expected duration 7 days with resolution of temperature and/or normalization of WBC Follow up culture results, LOT, clinical improvement F/U lab results and scheduling of doses  Arcola JanskyMeagan Decker, PharmD Clinical Pharmacy Resident Pager: [phone] 04/03/2015,10:03 PM   ADDENDUM: SCr 1.2; will continue with Zosyn 3.375g IV Q8H. Vernard GamblesVeronda Sarajane Fambrough, PharmD, BCPS 03/13/2015 11:25 PM

## 2015-03-25 NOTE — ED Notes (Signed)
Patient here from group home. Per EMS patient was eating steak tonight, began choking, and became unresponsive. Abdominal thrust was delivered by staff at group home without success. Upon EMS arrival patient had been unconscious for about 15 minutes; EMS found patient to be pulseless and apneic. PEA noted on monitor. CPR started by EMS. 4 doses of IV Epinephrine, and 1500 mL of NS given by EMS PTA. EMS stated ROSC @ approximately 30 minutes post LOC. Per EMS hx: MR, MS. VS PTA 130/100, 90hr, 12rr, 99% with king airway.

## 2015-03-25 NOTE — Procedures (Signed)
Central Venous Catheter Insertion Procedure Note Mackenzie Mcdonald [id number] [birth year]  Procedure: Insertion of Central Venous Catheter Indications: Assessment of intravascular volume, Drug and/or fluid administration and Frequent blood sampling  Procedure Details Consent: Unable to obtain consent because of altered level of consciousness. Time Out: Verified patient identification, verified procedure, site/side was marked, verified correct patient position, special equipment/implants available, medications/allergies/relevent history reviewed, required imaging and test results available.  Performed  Maximum sterile technique was used including antiseptics, cap, gloves, gown, hand hygiene, mask and sheet. Skin prep: Chlorhexidine; local anesthetic administered A antimicrobial bonded/coated triple lumen catheter was placed in the right internal jugular vein using the Seldinger technique.  Evaluation Blood flow good Complications: No apparent complications Patient did tolerate procedure well. Chest X-ray ordered to verify placement.  CXR: pending.  Procedure performed under direct ultrasound guidance for real time vessel cannulation.      Rutherford Guysahul Desai, [place] - C [facility] [facility] Pager: [phone]  or [phone] 2014-11-22, 11:53 PM

## 2015-03-25 NOTE — ED Notes (Signed)
Patient's caregivers from group home at bedside to see patient. MD aware.

## 2015-03-25 NOTE — ED Notes (Signed)
Attempted Report 

## 2015-03-25 NOTE — ED Provider Notes (Signed)
CSN: [id number]     Arrival date & time 04/05/2015  1956 History   First MD Initiated Contact with Patient 03/30/2015 2013     Chief Complaint  Patient presents with  . Cardiac Arrest    Patient is a 49 y.o. female presenting with syncope. The history is provided by the EMS personnel, a caregiver and medical records. The history is limited by the condition of the patient.  Loss of Consciousness Episode history:  Single Most recent episode:  Today Duration: 25-30 minutes total down time. Timing:  Constant Progression:  Improving Chronicity:  New Context comment:  Pt was witnessed eating steak, choking on this- attempted heimlich by staff at group home- about 10 mins later EMS arrived and pt found in PEA arrest, had CPR and 4 rounds of Epi, with King placed after piece of steak pulled from mouth- pulses returned Witnessed: yes     Past Medical History  Diagnosis Date  . Mental disorder     autism  profound  . Blood dyscrasia     thrombocytopenia  . Bipolar 1 disorder   . 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 Urinary incontinence    Past Surgical History  Procedure Laterality Date  . Abdominal hysterectomy    . Dental restoration/extraction with x-ray N/A 04/01/2013    Procedure: CLEANING WITH XRAY, DENTAL RESTORATION AND EXTRACTIONS ;  Surgeon: Esaw DaceWilliam E Milner Jr., DDS;  Location: [facility] OR;  Service: Oral Surgery;  Laterality: N/A;   No family history on file. Social History  Substance Use Topics  . Smoking status: Never Smoker   . Smokeless tobacco: Not on file  . Alcohol Use: No   OB History    No data available     Review of Systems  Unable to perform ROS: Patient unresponsive  Respiratory: Positive for choking.   Cardiovascular: Positive for syncope.      Allergies  Bee venom; Benadryl; Haldol; Lithium; Other; Phenothiazines; and Tegretol  Home Medications   Prior to Admission medications   Medication Sig Start Date End Date Taking? Authorizing  Provider  chlorhexidine (PERIDEX) 0.12 % solution Use as directed 5 mLs in the mouth or throat every evening. Brush 5 cc of solution on teeth and gums with a toothbrush after pm mouth care.   Yes Historical Provider, MD  Cholecalciferol (VITAMIN D3) 2000 UNITS TABS Take 4,000 Units by mouth daily.    Yes Historical Provider, MD  clonazePAM (KLONOPIN) 1 MG tablet Take 1 mg by mouth 2 (two) times daily.   Yes Historical Provider, MD  EPINEPHrine (EPIPEN) 0.3 mg/0.3 mL SOAJ injection Inject 0.3 mg into the muscle as needed (for allergic reaction to bee stings).   Yes Historical Provider, MD  escitalopram (LEXAPRO) 20 MG tablet Take 20 mg by mouth daily.   Yes Historical Provider, MD  lamoTRIgine (LAMICTAL) 25 MG tablet Take 50 mg by mouth 2 (two) times daily.   Yes Historical Provider, MD  mupirocin ointment (BACTROBAN) 2 % Apply 1 application topically 3 (three) times daily. Apply to picks on arms and legs   Yes Historical Provider, MD  OLANZapine (ZYPREXA) 10 MG tablet Take 10 mg by mouth 2 (two) times daily.   Yes Historical Provider, MD  topiramate (TOPAMAX) 100 MG tablet Take 100 mg by mouth 2 (two) times daily.   Yes Historical Provider, MD   BP 153/68 mmHg  Pulse 105  Temp(Src) 100.2 F (37.9 C) (Core (Comment))  Resp 24  Ht  (1.651 m)  Wt 201 lb 11.5 oz (91.5 kg)  BMI 33.57 kg/m2  SpO2 99% Physical Exam  Constitutional: She appears well-developed and well-nourished. She appears distressed.  HENT:  Head: Normocephalic and atraumatic.  King airway in place  Eyes:  Pupils initially 3mm, round, sluggishly reactive, equal  Neck: No tracheal deviation present.  Cardiovascular: Normal rate, regular rhythm and intact distal pulses.   Pulmonary/Chest: No stridor.  Course breath sounds bilat, worse on left. King airway in place. Pt taking irregular spontaneous breaths, assisted by BVM though king  Abdominal: Soft. She exhibits no distension. There is no tenderness.  Musculoskeletal:  She exhibits no edema.  Neurological:  Minimal movement of extremities. Taking spontaneous breaths  Skin: Skin is warm and dry. There is pallor.  Psychiatric:  Unresponsive   Nursing note and vitals reviewed.   ED Course  Procedures (including critical care time)  INTUBATION Performed by: Urban Gibson  Required items: required blood products, implants, devices, and special equipment available Patient identity confirmed: provided demographic data and hospital-assigned identification number Time out: Immediately prior to procedure a "time out" was called to verify the correct patient, procedure, equipment, support staff and site/side marked as required.  Indications: Respiratory failure, airway protection  Intubation method: Glidescope Laryngoscopy   Preoxygenation: BVM with king airway, Nog[place]  Sedatives: Etomidate Paralytic: Rocuronium  Tube Size: 8.0 cuffed  Post-procedure assessment: chest rise and ETCO2 monitor Breath sounds: equal and absent over the epigastrium Tube secured with: ETT holder Chest x-ray interpreted by radiologist and me.  Chest x-ray findings: endotracheal tube in appropriate position  Patient tolerated the procedure well with no immediate complications.      Labs Review Labs Reviewed  URINALYSIS, ROUTINE W REFLEX MICROSCOPIC (NOT AT [facility]) - Abnormal; Notable for the following:    APPearance CLOUDY (*)    Glucose, UA 250 (*)    Hgb urine dipstick LARGE (*)    Protein, ur 100 (*)    All other components within normal limits  COMPREHENSIVE METABOLIC PANEL - Abnormal; Notable for the following:    Potassium 3.3 (*)    CO2 15 (*)    Glucose, Bld 264 (*)    Creatinine, Ser 1.20 (*)    Calcium 7.4 (*)    Total Protein 4.7 (*)    Albumin 2.8 (*)    AST 194 (*)    ALT 244 (*)    Total Bilirubin 0.2 (*)    GFR calc non Af Amer 53 (*)    All other components within normal limits  CBC WITH DIFFERENTIAL/PLATELET - Abnormal; Notable for the  following:    WBC 18.9 (*)    RBC 3.75 (*)    Hemoglobin 11.8 (*)    HCT 35.5 (*)    Platelets 137 (*)    Neutro Abs 12.6 (*)    Lymphs Abs 5.3 (*)    All other components within normal limits  URINE MICROSCOPIC-ADD ON - Abnormal; Notable for the following:    Squamous Epithelial / LPF FEW (*)    Bacteria, UA FEW (*)    Casts GRANULAR CAST (*)    All other components within normal limits  CBC - Abnormal; Notable for the following:    WBC 11.0 (*)    Platelets 125 (*)    All other components within normal limits  BASIC METABOLIC PANEL - Abnormal; Notable for the following:    Potassium 2.8 (*)    Chloride 112 (*)    CO2  20 (*)    Glucose, Bld 145 (*)    Calcium 6.9 (*)    All other components within normal limits  BLOOD GAS, ARTERIAL - Abnormal; Notable for the following:    pH, Arterial 7.263 (*)    pO2, Arterial 53.3 (*)    Bicarbonate 17.4 (*)    Acid-base deficit 8.3 (*)    All other components within normal limits  MAGNESIUM - Abnormal; Notable for the following:    Magnesium 1.2 (*)    All other components within normal limits  PHOSPHORUS - Abnormal; Notable for the following:    Phosphorus 2.0 (*)    All other components within normal limits  TROPONIN I - Abnormal; Notable for the following:    Troponin I 0.13 (*)    All other components within normal limits  LACTIC ACID, PLASMA - Abnormal; Notable for the following:    Lactic Acid, Venous 2.4 (*)    All other components within normal limits  CALCIUM, IONIZED - Abnormal; Notable for the following:    Calcium, Ionized, Serum 4.1 (*)    All other components within normal limits  GLUCOSE, CAPILLARY - Abnormal; Notable for the following:    Glucose-Capillary 222 (*)    All other components within normal limits  GLUCOSE, CAPILLARY - Abnormal; Notable for the following:    Glucose-Capillary 138 (*)    All other components within normal limits  GLUCOSE, CAPILLARY - Abnormal; Notable for the following:     Glucose-Capillary 118 (*)    All other components within normal limits  CBC - Abnormal; Notable for the following:    WBC 13.0 (*)    RBC 3.39 (*)    Hemoglobin 10.6 (*)    HCT 30.6 (*)    Platelets 101 (*)    All other components within normal limits  BASIC METABOLIC PANEL - Abnormal; Notable for the following:    Sodium 133 (*)    CO2 19 (*)    Glucose, Bld 144 (*)    Calcium 7.1 (*)    Anion gap 4 (*)    All other components within normal limits  PHOSPHORUS - Abnormal; Notable for the following:    Phosphorus 1.6 (*)    All other components within normal limits  BLOOD GAS, ARTERIAL - Abnormal; Notable for the following:    pCO2 arterial 26.9 (*)    pO2, Arterial 167 (*)    Bicarbonate 16.4 (*)    Acid-base deficit 7.4 (*)    All other components within normal limits  GLUCOSE, CAPILLARY - Abnormal; Notable for the following:    Glucose-Capillary 140 (*)    All other components within normal limits  GLUCOSE, CAPILLARY - Abnormal; Notable for the following:    Glucose-Capillary 164 (*)    All other components within normal limits  GLUCOSE, CAPILLARY - Abnormal; Notable for the following:    Glucose-Capillary 160 (*)    All other components within normal limits  GLUCOSE, CAPILLARY - Abnormal; Notable for the following:    Glucose-Capillary 175 (*)    All other components within normal limits  GLUCOSE, CAPILLARY - Abnormal; Notable for the following:    Glucose-Capillary 146 (*)    All other components within normal limits  GLUCOSE, CAPILLARY - Abnormal; Notable for the following:    Glucose-Capillary 121 (*)    All other components within normal limits  GLUCOSE, CAPILLARY - Abnormal; Notable for the following:    Glucose-Capillary 125 (*)    All other components within  normal limits  BASIC METABOLIC PANEL - Abnormal; Notable for the following:    Glucose, Bld 116 (*)    Calcium 7.8 (*)    Anion gap 4 (*)    All other components within normal limits  CBC - Abnormal;  Notable for the following:    WBC 10.8 (*)    RBC 3.07 (*)    Hemoglobin 9.4 (*)    HCT 27.7 (*)    Platelets 91 (*)    All other components within normal limits  GLUCOSE, CAPILLARY - Abnormal; Notable for the following:    Glucose-Capillary 105 (*)    All other components within normal limits  GLUCOSE, CAPILLARY - Abnormal; Notable for the following:    Glucose-Capillary 129 (*)    All other components within normal limits  GLUCOSE, CAPILLARY - Abnormal; Notable for the following:    Glucose-Capillary 114 (*)    All other components within normal limits  GLUCOSE, CAPILLARY - Abnormal; Notable for the following:    Glucose-Capillary 151 (*)    All other components within normal limits  GLUCOSE, CAPILLARY - Abnormal; Notable for the following:    Glucose-Capillary 127 (*)    All other components within normal limits  GLUCOSE, CAPILLARY - Abnormal; Notable for the following:    Glucose-Capillary 103 (*)    All other components within normal limits  BASIC METABOLIC PANEL - Abnormal; Notable for the following:    CO2 20 (*)    Glucose, Bld 137 (*)    Calcium 8.1 (*)    All other components within normal limits  CBC - Abnormal; Notable for the following:    RBC 3.12 (*)    Hemoglobin 9.9 (*)    HCT 28.1 (*)    Platelets 95 (*)    All other components within normal limits  GLUCOSE, CAPILLARY - Abnormal; Notable for the following:    Glucose-Capillary 104 (*)    All other components within normal limits  GLUCOSE, CAPILLARY - Abnormal; Notable for the following:    Glucose-Capillary 146 (*)    All other components within normal limits  GLUCOSE, CAPILLARY - Abnormal; Notable for the following:    Glucose-Capillary 135 (*)    All other components within normal limits  GLUCOSE, CAPILLARY - Abnormal; Notable for the following:    Glucose-Capillary 116 (*)    All other components within normal limits  GLUCOSE, CAPILLARY - Abnormal; Notable for the following:    Glucose-Capillary  120 (*)    All other components within normal limits  GLUCOSE, CAPILLARY - Abnormal; Notable for the following:    Glucose-Capillary 136 (*)    All other components within normal limits  I-STAT CG4 LACTIC ACID, ED - Abnormal; Notable for the following:    Lactic Acid, Venous 4.78 (*)    All other components within normal limits  I-STAT ARTERIAL BLOOD GAS, ED - Abnormal; Notable for the following:    pH, Arterial 7.206 (*)    pO2, Arterial 45.0 (*)    Bicarbonate 16.1 (*)    Acid-base deficit 12.0 (*)    All other components within normal limits  MRSA PCR SCREENING  CULTURE, RESPIRATORY (NON-EXPECTORATED)  MAGNESIUM  TROPONIN I  LACTIC ACID, PLASMA  PROCALCITONIN  PROCALCITONIN  HEMOGLOBIN A1C  GLUCOSE, CAPILLARY  PROCALCITONIN  MAGNESIUM  TOPIRAMATE LEVEL  MAGNESIUM  LAMOTRIGINE LEVEL  I-STAT TROPOININ, ED    Imaging Review Dg Chest Port 1 View  03/28/2015  CLINICAL DATA:  Acute respiratory failure EXAM: PORTABLE CHEST 1  VIEW COMPARISON:  Portable exam 0440 hours compared to 03/27/2015 FINDINGS: Tip of endotracheal tube projects 4.4 cm above carina. Nasogastric tube coiled in stomach. RIGHT jugular central venous catheter tip projects over cavoatrial junction. Normal heart size, mediastinal contours and pulmonary vascularity. Persistent consolidation LEFT lower lobe. Mild atelectasis versus infiltrate at RIGHT base. Upper lungs clear. No pleural effusion or pneumothorax. IMPRESSION: Persistent atelectasis versus consolidation LEFT lower lobe. Electronically Signed   By: Ulyses Southward M.D.   On: 03/28/2015 07:22   I have personally reviewed and evaluated these images and lab results as part of my medical decision-making.   EKG Interpretation   Date/Time:  Thursday March 25 2015 20:45:32 EDT Ventricular Rate:  87 PR Interval:  173 QRS Duration: 79 QT Interval:  444 QTC Calculation: 534 R Axis:   57 Text Interpretation:  Sinus rhythm Low voltage, extremity leads Prolonged   QT interval ED PHYSICIAN INTERPRETATION AVAILABLE IN [facility]  Confirmed by TEST, Record ([id number]) on 03/26/2015 7:23:47 AM      MDM   Final diagnoses:  Encounter for central line placement  History of ETT  Anoxic encephalopathy (HCC)  Acute respiratory failure (HCC)    49 yo F with hx severe autism, bipolar disorder, thrombocytopenia presenting from group home post cardiac arrest. Was witnessed aspirating piece of steak, choking, with abdominal thrusts given shortly afterward at home. PEA arrest found by EMS, ROSC achieved by them after 4 rounds Epi / 1500 cc NS / king airway placement after removing piece of steak from oropharynx. Total down time about 30 minutes per EMS estimation.   Upon arrival pt taking spontaneous irregular breaths assisted by king/BVM but otherwise remains unresponsive with minimal movements of extremities. Intubated as above. Hypotension following intubation, given additional IVF and a push dose of Epi with improvement, though as BPs again started trending down ordered levophed gtt PRN. Post intubation sedation on intermittent basis PRN with fentanyl/versed given BPs and mental status. Intensivist consulted. Started on cooled IVF while awaiting further decision making regarding post code cooling. Admitted to ICU for further management. Group home updated on status - pt without family members.   Case discussed with Dr. Dalene Seltzer, who oversaw management of this patient.     Urban Gibson, MD 03/29/15 1327  Alvira Monday, MD 03/30/15 1350

## 2015-03-25 NOTE — ED Notes (Signed)
EMS reported finding and extracting an approximately 3" diameter piece of steak from patient's throat prior to placing king airway.

## 2015-03-26 ENCOUNTER — Inpatient Hospital Stay (HOSPITAL_COMMUNITY): Payer: Medicaid Other

## 2015-03-26 DIAGNOSIS — G931 Anoxic brain damage, not elsewhere classified: Secondary | ICD-10-CM | POA: Insufficient documentation

## 2015-03-26 DIAGNOSIS — Z515 Encounter for palliative care: Secondary | ICD-10-CM

## 2015-03-26 DIAGNOSIS — J9601 Acute respiratory failure with hypoxia: Secondary | ICD-10-CM

## 2015-03-26 DIAGNOSIS — I469 Cardiac arrest, cause unspecified: Secondary | ICD-10-CM

## 2015-03-26 LAB — BLOOD GAS, ARTERIAL
ACID-BASE DEFICIT: 8.3 mmol/L — AB (ref 0.0–2.0)
BICARBONATE: 17.4 meq/L — AB (ref 20.0–24.0)
DRAWN BY: 244861
FIO2: 1
LHR: 16 {breaths}/min
O2 SAT: 84.1 %
PEEP: 10 cmH2O
PH ART: 7.263 — AB (ref 7.350–7.450)
Patient temperature: 98.6
TCO2: 18.7 mmol/L (ref 0–100)
VT: 450 mL
pCO2 arterial: 39.9 mmHg (ref 35.0–45.0)
pO2, Arterial: 53.3 mmHg — ABNORMAL LOW (ref 80.0–100.0)

## 2015-03-26 LAB — BASIC METABOLIC PANEL
ANION GAP: 6 (ref 5–15)
BUN: 15 mg/dL (ref 6–20)
CHLORIDE: 112 mmol/L — AB (ref 101–111)
CO2: 20 mmol/L — ABNORMAL LOW (ref 22–32)
Calcium: 6.9 mg/dL — ABNORMAL LOW (ref 8.9–10.3)
Creatinine, Ser: 0.84 mg/dL (ref 0.44–1.00)
GFR calc Af Amer: >60 mL/min (ref >60)
GFR calc non Af Amer: >60 mL/min (ref >60)
Glucose, Bld: 145 mg/dL — ABNORMAL HIGH (ref 65–99)
POTASSIUM: 2.8 mmol/L — AB (ref 3.5–5.1)
SODIUM: 138 mmol/L (ref 135–145)

## 2015-03-26 LAB — GLUCOSE, CAPILLARY
GLUCOSE-CAPILLARY: 85 mg/dL (ref 65–99)
GLUCOSE-CAPILLARY: 118 mg/dL — AB (ref 65–99)
Glucose-Capillary: 222 mg/dL — ABNORMAL HIGH (ref 65–99)
Glucose-Capillary: 138 mg/dL — ABNORMAL HIGH (ref 65–99)
Glucose-Capillary: 140 mg/dL — ABNORMAL HIGH (ref 65–99)
Glucose-Capillary: 164 mg/dL — ABNORMAL HIGH (ref 65–99)

## 2015-03-26 LAB — MAGNESIUM: Magnesium: 1.2 mg/dL — ABNORMAL LOW (ref 1.7–2.4)

## 2015-03-26 LAB — LACTIC ACID, PLASMA
LACTIC ACID, VENOUS: 1.5 mmol/L (ref 0.5–2.0)
LACTIC ACID, VENOUS: 2.4 mmol/L — AB (ref 0.5–2.0)

## 2015-03-26 LAB — PHOSPHORUS: Phosphorus: 2 mg/dL — ABNORMAL LOW (ref 2.5–4.6)

## 2015-03-26 LAB — CBC
HCT: 37.2 % (ref 36.0–46.0)
HEMOGLOBIN: 12.5 g/dL (ref 12.0–15.0)
MCH: 30.8 pg (ref 26.0–34.0)
MCHC: 33.6 g/dL (ref 30.0–36.0)
MCV: 91.6 fL (ref 78.0–100.0)
Platelets: 125 10*3/uL — ABNORMAL LOW (ref 150–400)
RBC: 4.06 MIL/uL (ref 3.87–5.11)
RDW: 12.9 % (ref 11.5–15.5)
WBC: 11 10*3/uL — AB (ref 4.0–10.5)

## 2015-03-26 LAB — TROPONIN I: Troponin I: 0.13 ng/mL — ABNORMAL HIGH (ref <0.031)

## 2015-03-26 LAB — PROCALCITONIN: PROCALCITONIN: 0.86 ng/mL

## 2015-03-26 LAB — MRSA PCR SCREENING: MRSA BY PCR: NEGATIVE

## 2015-03-26 MED ORDER — VITAL HIGH PROTEIN PO LIQD
1000.0000 mL | ORAL | Status: DC
  Administered 2015-03-26 – 2015-03-29 (×4): 1000 mL
  Filled 2015-03-26 (×6): qty 1000

## 2015-03-26 MED ORDER — SODIUM CHLORIDE 0.9 % IV SOLN
INTRAVENOUS | Status: DC
  Administered 2015-03-26: 12:00:00 via INTRAVENOUS

## 2015-03-26 MED ORDER — MAGNESIUM SULFATE 2 GM/50ML IV SOLN
2.0000 g | Freq: Once | INTRAVENOUS | Status: AC
  Administered 2015-03-26: 2 g via INTRAVENOUS
  Filled 2015-03-26: qty 50

## 2015-03-26 MED ORDER — ADULT MULTIVITAMIN W/MINERALS CH
1.0000 | ORAL_TABLET | Freq: Every day | ORAL | Status: DC
  Administered 2015-03-26 – 2015-03-30 (×5): 1
  Filled 2015-03-26 (×5): qty 1

## 2015-03-26 MED ORDER — SODIUM CHLORIDE 0.9 % IV SOLN
0.0300 [IU]/min | INTRAVENOUS | Status: DC
  Administered 2015-03-26 – 2015-03-27 (×2): 0.03 [IU]/min via INTRAVENOUS
  Filled 2015-03-26 (×2): qty 2

## 2015-03-26 MED ORDER — CHLORHEXIDINE GLUCONATE 0.12% ORAL RINSE (MEDLINE KIT)
15.0000 mL | Freq: Two times a day (BID) | OROMUCOSAL | Status: DC
  Administered 2015-03-26 – 2015-03-30 (×10): 15 mL via OROMUCOSAL

## 2015-03-26 MED ORDER — POTASSIUM PHOSPHATES 15 MMOLE/5ML IV SOLN
20.0000 meq | Freq: Once | INTRAVENOUS | Status: AC
  Administered 2015-03-26: 20 meq via INTRAVENOUS
  Filled 2015-03-26: qty 4.55

## 2015-03-26 MED ORDER — SODIUM CHLORIDE 0.9 % IV BOLUS (SEPSIS)
1000.0000 mL | Freq: Once | INTRAVENOUS | Status: AC
  Administered 2015-03-26: 1000 mL via INTRAVENOUS

## 2015-03-26 MED ORDER — ANTISEPTIC ORAL RINSE SOLUTION (CORINZ)
7.0000 mL | OROMUCOSAL | Status: DC
  Administered 2015-03-26 – 2015-03-31 (×50): 7 mL via OROMUCOSAL

## 2015-03-26 MED ORDER — POTASSIUM CHLORIDE 20 MEQ/15ML (10%) PO SOLN
40.0000 meq | Freq: Three times a day (TID) | ORAL | Status: AC
  Administered 2015-03-26 (×2): 40 meq
  Filled 2015-03-26 (×2): qty 30

## 2015-03-26 MED ORDER — PRO-STAT SUGAR FREE PO LIQD
60.0000 mL | Freq: Two times a day (BID) | ORAL | Status: DC
  Administered 2015-03-26 – 2015-03-30 (×9): 60 mL
  Filled 2015-03-26 (×9): qty 60

## 2015-03-26 MED ORDER — DEXTROSE 5 % IV SOLN
2.0000 ug/min | INTRAVENOUS | Status: DC
  Administered 2015-03-26: 40 ug/min via INTRAVENOUS
  Administered 2015-03-26: 50 ug/min via INTRAVENOUS
  Administered 2015-03-26: 25 ug/min via INTRAVENOUS
  Filled 2015-03-26 (×3): qty 16

## 2015-03-26 MED ORDER — POTASSIUM CHLORIDE 10 MEQ/50ML IV SOLN
10.0000 meq | INTRAVENOUS | Status: AC
  Administered 2015-03-26 (×6): 10 meq via INTRAVENOUS
  Filled 2015-03-26 (×6): qty 50

## 2015-03-26 NOTE — Progress Notes (Signed)
Spoke with patient's guardian over the phone.   [employer] sent us the affidavits for DNR.  Myself and Dr. Marchelle Gearingamaswamy who knew the patient from before signed the forms and notarized them with the hospital notary public then forms were faxed to[facility] and saved in the patient's chart.  Based on that, I believe we have satisfied requirement for DNR status.  Will make patient a full DNR with no further escalation of care.  Guardian is to check with his superior and to inform us how to proceed to comfort care.  The patient is critically ill with multiple organ systems failure and requires high complexity decision making for assessment and support, frequent evaluation and titration of therapies, application of advanced monitoring technologies and extensive interpretation of multiple databases.   Critical Care Time devoted to patient care services described in this note is  60  Minutes. This time reflects time of care of this signee Dr Koren BoundWesam Allora Bains. This critical care time does not reflect procedure time, or teaching time or supervisory time of PA/NP/Med student/Med Resident etc but could involve care discussion time.  Alyson ReedyWesam G. Ghina Bittinger, M.D. [facility]. Pager: [phone]. After hours pager: [phone].

## 2015-03-26 NOTE — Procedures (Signed)
ELECTROENCEPHALOGRAM REPORT  Date of Study: 03/26/2015  Patient's Name: Mackenzie Mcdonald MRN: [id number] Date of Birth: Apr 30, 1966  Referring Provider: Dr. Koren BoundWesam Yacoub  Clinical History: This is a 49 year old man s/p PEA arrest after choking.   Medications: No sedating medications listed. insulin aspart (novoLOG) injection 0-15 Units norepinephrine (LEVOPHED) 16 mg in dextrose 5 % 250 mL (0.064 mg/mL) infusion pantoprazole (PROTONIX) injection 40 mg piperacillin-tazobactam (ZOSYN) IVPB 3.375 g potassium chloride 20 MEQ/15ML (10%) solution 40 mEq vasopressin (PITRESSIN) 40 Units in sodium chloride 0.9 % 250 mL (0.16 Units/mL) infusion  Technical Summary: A multichannel digital EEG recording measured by the international 10-20 system with electrodes applied with paste and impedances below 5000 ohms performed as portable with EKG monitoring in an intubated and unresponsive patient. No sedating medications listed.  Hyperventilation and photic stimulation were not performed.  The digital EEG was referentially recorded, reformatted, and digitally filtered in a variety of bipolar and referential montages for optimal display.   Description: The patient is intubated and unresponsive during the recording. There is no clear posterior dominant rhythm. EEG is read at a sensitivity of 3 uV/mm. The background consists of a large amount of 2-3 Hz low voltage delta slowing with brief 0.5 to 1 second bursts of 4-5 theta activity. AT times there are sharp transients seen during these bursts that appear artifactual, some with report of body jerks. Normal sleep architecture is not seen. There is no reactivity to noxious stimulation. There were no clear epileptiform discharges or electrographic seizures seen.    EKG lead showed sinus tachycardia.  Impression: This EEG is abnormal due to severe diffuse slowing of the background.  Clinical Correlation of the above findings indicates severe diffuse cerebral  dysfunction that is non-specific in etiology and can be seen with hypoxic/ischemic injury, toxic/metabolic encephalopathies, or sedating medication effect. Body jerks did not show clear epileptiform correlate, sharp transients appeared artifactual from movement. There were no electrographic seizures seen in this study.    Patrcia DollyKaren Katalena Malveaux, M.D.

## 2015-03-26 NOTE — Progress Notes (Signed)
Per Dr. Molli KnockYacoub, for every decrease in FIO2 by 10% decrease PEEP as long as sats maintain.

## 2015-03-26 NOTE — Progress Notes (Signed)
Initial Nutrition Assessment  DOCUMENTATION CODES:   Obesity unspecified  INTERVENTION:   Initiate Vital High Protein @ 30 ml/hr via OG tube   60 ml Prostat BID.    Tube feeding regimen provides 1120 kcal, 123 grams of protein, and 601 ml of H2O.    NUTRITION DIAGNOSIS:   Inadequate oral intake related to inability to eat as evidenced by NPO status.  GOAL:   Provide needs based on ASPEN/SCCM guidelines  MONITOR:   I & O's, Vent status, Labs, Weight trends, TF tolerance  REASON FOR ASSESSMENT:   Consult Enteral/tube feeding initiation and management  ASSESSMENT:   49 y.o. F from group home (due to mental retardation) [facility] after she suffered PEA arrest from choking on a big piece of steak. ROSC achieved in roughly 30 minutes.  Patient is currently intubated on ventilator support MV: 13.7 L/min Temp (24hrs), Avg:96.7 F (35.9 C), Min:92.7 F (33.7 C), Max:100.6 F (38.1 C)  OG tube in place.  Medications reviewed and include: magnesium sulfate, potassium phosphate, KCl Labs reviewed: potassium low (2.8), phosphorus (2.0) and magnesium (1.2) low Nutrition-Focused physical exam completed. Findings are no fat depletion, no muscle depletion, and mild edema.    Diet Order:  Diet NPO time specified  Skin:  Reviewed, no issues  Last BM:  unknown  Height:   Ht Readings from Last 1 Encounters:  2014-12-14 5\' 5"  (1.651 m)    Weight:   Wt Readings from Last 1 Encounters:  03/26/15 193 lb 12.6 oz (87.9 kg)    Ideal Body Weight:  56.8 kg  BMI:  Body mass index is 32.25 kg/(m^2).  Estimated Nutritional Needs:   Kcal:  [phone]  Protein:  >/= 113 grams  Fluid:  > 1.5 L/day  EDUCATION NEEDS:   No education needs identified at this time  Kendell BaneHeather Mandrell Vangilder RD, LDN, CNSC [phone] Pager [phone] After Hours Pager

## 2015-03-26 NOTE — Progress Notes (Signed)
Patient transported to CT and back to room 2H03 without any complications.

## 2015-03-26 NOTE — Progress Notes (Signed)
Bedside EEG completed, results pending. 

## 2015-03-26 NOTE — Clinical Social Work Note (Signed)
Clinical Social Work Assessment  Patient Details  Name: Mackenzie Mcdonald MRN: [id number] Date of Birth: 1966-02-17  Date of referral:  03/26/15               Reason for consult:  Facility Placement (From: [facility])                Permission sought to share information with:  Other, Guardian (Patient on vent- Ward of the State- ) Permission granted to share information::     Name::     DeCarlos West  [id number]  Agency::     Relationship::     Contact Information:     Housing/Transportation Living arrangements for the past 2 months:  Group Home Source of Information:  Guardian Jeralyn Bennett(Dante Woolard- [phone]- [place] DSS) Patient Interpreter Needed:  None Criminal Activity/Legal Involvement Pertinent to Current Situation/Hospitalization:  No - Comment as needed Significant Relationships:  Other(Comment) (Guardian   Other relationships currently unknown) Lives with:  Facility Resident (Group HOme) Do you feel safe going back to the place where you live?   (NA- on vent-  probably comfort care) Need for family participation in patient care:  No (Coment) (Ward of the state)  Care giving concerns:  Group home resident placed on vent. Unresponsive  Social Worker assessment / plan: CS[facility]-  [phone]- Dwain SarnaSteve Harris- Manager.  Patient is a ward of [place] DSS and is current intubated.  Patient is now a DNR and is not responsive. DSS is working to seek further DNR status for patient.  CSW spoke briefly with a DSS worker Marcial Pacasante Wollard who requested that medical records need to be faxed to DSS to clarify DNR status and possible future extubation from the vent.  CSW obtained fax number  [phone]- was notified this afternoon that the Charge Nurse had already attended to this.  CSW services will monitor and assist as needed.  Employment status:  Disabled (Comment on whether or not currently  receiving Disability) Insurance information:  [place] Recommendations:  Not assessed at this time Information / Referral to community resources:  Other (Comment Required) ([place] DSS- Guardianship)  Patient/Family's Response to care:  Unknown at this time. CSW unable to reach legal guardian; Patient on vent.  Patient/Family's Understanding of and Emotional Response to Diagnosis, Current Treatment, and Prognosis:  No family present and patient unable to respond to diagnosis, treatment or prognosis at this time.    Emotional Assessment Appearance:  Appears stated age Attitude/Demeanor/Rapport:  Unable to Assess (on vent) Affect (typically observed):  Unable to Assess (on vent) Orientation:   (On vent- unresponsive) Alcohol / Substance use:  Never Used Psych involvement (Current and /or in the community):  No (Comment)  Discharge Needs  Concerns to be addressed:  Care Coordination Readmission within the last 30 days:  No Current discharge risk:  Terminally ill, Other (On vent- anticipate comfort care and terminal wean in the future) Barriers to Discharge:  Continued Medical Work up   Lovette ClicheCrowder, Sonda Coppens T, LCSW 03/26/2015, 4:15 PM

## 2015-03-26 NOTE — Progress Notes (Signed)
PULMONARY / CRITICAL CARE MEDICINE   Name: Mackenzie Mcdonald MRN: [id number] DOB: Oct 22, 1965    ADMISSION DATE:  04/18/2015 CONSULTATION DATE:  2015-04-18  REFERRING MD :  EDP  CHIEF COMPLAINT:  Cardiac Arrest  INITIAL PRESENTATION:  49 y.o. F from group home (due to mental retardation) brought to [facility] ED 10/20 after she suffered PEA arrest from choking on a big piece of steak.  ROSC achieved in roughly 30 minutes.  PCCM called for admission.    STUDIES:  CXR 10/20 >>> ETT in place.  Perihilar heterogenous airspace opacities L > R, worrisome for edema vs aspiration.  SIGNIFICANT EVENTS: 10/20 - admitted after PEA arret.   HISTORY OF PRESENT ILLNESS:  Pt is intubated; therefore, this HPI is obtained from chart review. Mackenzie Mcdonald is a 49 y.o. F with PMH as outlined below including mental retardation per report for which she lives in a group home.  She was at group home evening of 10/20 eating dinner when she choked on a piece of steak and became unresponsive.  Abdominal thrusts were attempted by group home staff but were unsuccessful.  EMS was dispatched and on their arrival, pt was in PEA.  Per EMS, ROSC was achieved in roughly 30 minutes.   EMS reported that they extracted an approximately 3" diameter piece of steak from pt's throat prior to placing a king airway.  In ED, she was intubated and was started on levophed gtt for hypotension.  PCCM was called for admission.  SUBJECTIVE: Sedated, intubated.  VITAL SIGNS: Temp:  [92.7 F (33.7 C)-99.3 F (37.4 C)] 99.3 F (37.4 C) (10/21 0732) Pulse Rate:  [28-115] 104 (10/21 0732) Resp:  [0-39] 33 (10/21 0730) BP: (72-164)/(21-81) 95/62 mmHg (10/21 0732) SpO2:  [83 %-100 %] 96 % (10/21 0732) FiO2 (%):  [80 %-100 %] 80 % (10/21 0730) Weight:  [87.4 kg (192 lb 10.9 oz)-87.9 kg (193 lb 12.6 oz)] 87.9 kg (193 lb 12.6 oz) (10/21 0447) HEMODYNAMICS: CVP:  [5 mmHg-8 mmHg] 7 mmHg VENTILATOR SETTINGS: Vent Mode:  [-] PRVC FiO2 (%):  [80 %-100 %] 80  % Set Rate:  [16 bmp] 16 bmp Vt Set:  [450 mL-500 mL] 450 mL PEEP:  [5 cmH20-14 cmH20] 12 cmH20 Plateau Pressure:  [26 cmH20-28 cmH20] 26 cmH20 INTAKE / OUTPUT: Intake/Output      10/20 0701 - 10/21 0700 10/21 0701 - 10/22 0700   I.V. (mL/kg) 1252.1 (14.2)    IV Piggyback 1150    Total Intake(mL/kg) 2402.1 (27.3)    Urine (mL/kg/hr) 650    Total Output 650     Net +1752.1            PHYSICAL EXAMINATION: General: Young female, unresponsive, myoclonic. Neuro: Unresponsive despite no sedation.  Absent corneal reflex, no gag and no pupillary, eyes deviated down and to the right, frequent myoclonus. HEENT: [place]/AT. Pupils dilated bilaterally, non-responsive. Cardiovascular: RRR, no M/R/G.  Lungs: Respirations even and unlabored.  Coarse L > R. Abdomen: BS x 4, soft, NT/ND.  Musculoskeletal: No gross deformities, no edema.  Skin: Intact, warm, no rashes.  LABS:  CBC  Recent Labs Lab 04-18-2015 2003 03/26/15 0417  WBC 18.9* 11.0*  HGB 11.8* 12.5  HCT 35.5* 37.2  PLT 137* 125*   Coag's No results for input(s): APTT, INR in the last 168 hours. BMET  Recent Labs Lab 04-18-2015 2003 03/26/15 0417  NA 138 138  K 3.3* 2.8*  CL 109 112*  CO2 15* 20*  BUN 15 15  CREATININE 1.20* 0.84  GLUCOSE 264* 145*   Electrolytes  Recent Labs Lab 03/30/2015 2003 03/26/15 0417  CALCIUM 7.4* 6.9*  MG 1.8 1.2*  PHOS  --  2.0*   Sepsis Markers  Recent Labs Lab 03/23/2015 2130 03/28/2015 2153 03/26/15 0056 03/26/15 0410 03/26/15 0417  LATICACIDVEN  --  4.78* 1.5 2.4*  --   PROCALCITON <0.10  --   --   --  0.86   ABG  Recent Labs Lab 03/24/2015 2200 03/26/15 0400  PHART 7.206* 7.263*  PCO2ART 39.0 39.9  PO2ART 45.0* 53.3*   Liver Enzymes  Recent Labs Lab 03/22/2015 2003  AST 194*  ALT 244*  ALKPHOS 97  BILITOT 0.2*  ALBUMIN 2.8*   Cardiac Enzymes  Recent Labs Lab 03/16/2015 2130 03/26/15 0418  TROPONINI <0.03 0.17*   Glucose  Recent Labs Lab 03/26/15 0038   GLUCAP 222*    Imaging Dg Chest Port 1 View  03/26/2015  CLINICAL DATA:  Respiratory failure EXAM: PORTABLE CHEST 1 VIEW COMPARISON:  Chest radiograph from earlier today. FINDINGS: Endotracheal tube tip is 2.9 cm above the carina. Enteric tube enters the stomach, with tip not seen on this image. Right internal jugular central venous catheter terminates at the cavoatrial junction. Pacer pad overlies the left upper chest. Stable cardiomediastinal silhouette with normal heart size. No pneumothorax. No pleural effusion. There is moderate to severe pulmonary edema, slightly worsened. IMPRESSION: Well-positioned lines and tubes. Slight worsening of moderate to severe pulmonary edema. Electronically Signed   By: Delbert PhenixJason A Poff M.D.   On: 03/26/2015 07:20   Dg Chest Port 1 View  03/26/2015  CLINICAL DATA:  Central line placement.  Initial encounter. EXAM: PORTABLE CHEST 1 VIEW COMPARISON:  Chest radiograph performed 03/28/2015 FINDINGS: The patient's right IJ line is noted ending about the cavoatrial junction. The endotracheal tube is seen ending 2-3 cm above the carina. An enteric tube is noted extending below the diaphragm. Dense bilateral central airspace opacification is perhaps mildly worsened from the prior study, concerning for multifocal pneumonia or pulmonary edema. No pleural effusion or pneumothorax is seen. The cardiomediastinal silhouette is borderline normal in size. No acute osseous abnormalities are identified. Mild degenerative change is noted at the right glenohumeral joint. An external pacing pad is noted. IMPRESSION: 1. Right IJ line noted ending about the cavoatrial junction. 2. Dense bilateral central airspace opacification is perhaps mildly worsened from the prior study, concerning for multifocal pneumonia or pulmonary edema. Electronically Signed   By: Roanna RaiderJeffery  Chang M.D.   On: 03/26/2015 00:20   Dg Chest Portable 1 View  04/05/2015  CLINICAL DATA:  Post cardiac arrest. EXAM: PORTABLE  CHEST 1 VIEW COMPARISON:  None. FINDINGS: Normal cardiac silhouette and mediastinal contours. Endotracheal tube overlies the tracheal air column with tip at the level of the carina. No pneumothorax. Enteric tube tip and side port project over the stomach. Transcutaneous pacer leads overlie the cardiac apex. Pulmonary vasculature is indistinct. Perihilar heterogeneous airspace opacities. No supine evidence of pleural effusion or pneumothorax. No acute osseus abnormalities. IMPRESSION: 1. Endotracheal tube tip at the level of the carina. Retraction approximately 2.5 cm is recommended. Otherwise, appropriately positioned support apparatus. No supine evidence of pneumothorax. 2. Perihilar heterogeneous airspace opacities worrisome for alveolar pulmonary edema though aspiration could have a similar appearance. Continued attention on follow-up is recommended. Electronically Signed   By: Simonne ComeJohn  Watts M.D.   On: 03/10/2015 20:53   Dg Abd Portable 1v  03/22/2015  CLINICAL DATA:  Orogastric catheter placement EXAM:  PORTABLE ABDOMEN - 1 VIEW COMPARISON:  None. FINDINGS: Scattered large and small bowel gas is noted. No free air is seen. An orogastric catheter is noted coiled within the stomach although lies coned off the superior aspect of this abdominal film. It is better visualized on the prior chest x-ray. Degenerative changes of the lumbar spine and hip joints are noted. IMPRESSION: Orogastric catheter within the stomach. Electronically Signed   By: Alcide Clever M.D.   On: 04-13-2015 20:53    ASSESSMENT / PLAN:  CARDIOVASCULAR CVL pending 10/20 >>> A:  S/p PEA arrest after asphyxiating on piece of steak 10/20 - ROSC achieved in ~ 30 minutes. Hypotension post arrest - ? Cardiogenic shock. P:  Levophed as needed for goal MAP > 65, 30 mcg. Vasopressin 0.03 units. Goal CVP 8 - 12. Trend troponin / lactate. Consider TTE.  PULMONARY OETT 10/20 >>> A: VDRF following PEA arrest after asphyixiation  10/20. Aspiration PNA. P:   Full mechanical support, ARDS protocol. ABG noted - increased PEEP to 12 and RT asked to perform recruitment maneuvers, FiO2 80%. VAP prevention measures. CXR in AM. ABG in AM.  RENAL A:   Hypokalemia. AG metabolic acidosis - lactate. AKI. Hypocalcemia - corrects to 8.36. P:   K3PO4 ordered. MgSO4 ordered. NS @ 125. BMP in AM.  GASTROINTESTINAL A:   GI prophylaxis. Nutrition. P:   SUP: Pantoprazole. TF per nutrition.  HEMATOLOGIC A:   Mild anemia. Thrombocytopenia. VTE Prophylaxis. P:  Transfuse for Hgb < 7. Monitor platelet counts. SCD's / Heparin. CBC in AM.  INFECTIOUS A:   Aspiration PNA. P:   BCx2 10/20 > Sputum Cx 10/20 > Abx: Zosyn, start date 10/20, day 2/x. PCT algorithm to limit abx exposure.  ENDOCRINE A:   Hyperglycemia. P:   SSI. Hgb A1c.  NEUROLOGIC A:   Acute metabolic encephalopathy. Hx mental retardation, autism, bipolar disorder, ? Seizure disorder (on meds). Patient is posturing and myoclonic with severe shock, no reasonable chance at any meaningful recovery. P:   D/C all sedation. Daily WUA. Continue outpatient lamotrigine, topiramate. Check lamotrigine, topiramate levels. Consider neuro consult pending conversation with guardian. Hold outpatient clonazepam, escitalopram, olanzapine.  Family updated: RN from group home at bedside.  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  Her CSW is Social research officer, government (phone #: [phone]).  Pt with profound autism / mental disorder and functional status at baseline not great.  She did participate in group activities but required 1+ assistance at minimum.  Due to her history and functional status at baseline combined with ~ 30 minutes downtime (potentiall more), she is not a hypothermia candidate.  I called the number above on 10/21 and the guardian was "unavailable at this time".  This will be an ongoing efforts.  Interdisciplinary Family Meeting v Palliative Care Meeting:   Due by: 10/26.  The patient is critically ill with multiple organ systems failure and requires high complexity decision making for assessment and support, frequent evaluation and titration of therapies, application of advanced monitoring technologies and extensive interpretation of multiple databases.   Critical Care Time devoted to patient care services described in this note is  35  Minutes. This time reflects time of care of this signee Dr Koren Bound. This critical care time does not reflect procedure time, or teaching time or supervisory time of PA/NP/Med student/Med Resident etc but could involve care discussion time.  Alyson Reedy, M.D. [facility] Pulmonary/Critical Care Medicine. Pager: [phone]. After hours pager: [phone].

## 2015-03-26 NOTE — ED Provider Notes (Signed)
Medical screening examination/treatment/procedure(s) were conducted as a shared visit with resident-physician practitioner(s) and myself.  I personally evaluated the patient during the encounter.  Pt is a 49 y.o. 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  Approx 15 minutes of down time prior to EMS arrival without chest compressions, and CPR for 15 minutes on EMS arrival. Pt in PEA and received NS and epinephrine. 2 Attempts at intubation were made and large steak piece was removed from patient's airway prior to placing a [facility].  After Brooke DareKing was placed, patient was noted to have ROSC.  On arrival to the ED, patient initially with normal blood pressure.  Brooke DareKing was removed and pt was intubated with 8.0ETT using glidescope. Patient with hypotension after intubation, given epinephrine, then started on levophed drip.  Critical care was consulted for admission.  CRITICAL CARE:Post CPR Performed by: Rhae LernerSchlossman, Baila Rouse Elizabeth   Total critical care time: 30minutes  Critical care time was exclusive of separately billable procedures and treating other patients.  Critical care was necessary to treat or prevent imminent or life-threatening deterioration.  Critical care was time spent personally by me on the following activities: development of treatment plan with patient and/or surrogate as well as nursing, discussions with consultants, evaluation of patient's response to treatment, examination of patient, obtaining history from patient or surrogate, ordering and performing treatments and interventions, ordering and review of laboratory studies, ordering and review of radiographic studies, pulse oximetry and re-evaluation of patient's condition.     Alvira MondayErin Laramie Gelles, MD 03/26/15 [phone]

## 2015-03-26 NOTE — Procedures (Signed)
Arterial Catheter Insertion Procedure Note Mackenzie Mcdonald [id number] 24-Feb-1966  Procedure: Insertion of Arterial Catheter  Indications: Blood pressure monitoring  Procedure Details Consent: Unable to obtain consent because of altered level of consciousness. Time Out: Verified patient identification, verified procedure, site/side was marked, verified correct patient position, special equipment/implants available, medications/allergies/relevent history reviewed, required imaging and test results available.  Performed  Maximum sterile technique was used including antiseptics, cap, gloves, gown, hand hygiene, mask and sheet. Skin prep: Chlorhexidine; local anesthetic administered 20 gauge catheter was inserted into right radial artery using the Seldinger technique.  Evaluation Blood flow good; BP tracing good. Complications: No apparent complications.   [name], Aloha GellJohn P 03/26/2015

## 2015-03-26 NOTE — Progress Notes (Signed)
Spoke with patient's nurse, patient care coordinator and patient's group home director on three different occasions.  Unable to get hold of guardian.  [place] if [place] can get hold of guardian.  Alyson ReedyWesam G. Rodel Glaspy, M.D. [facility] Pulmonary/Critical Care Medicine. Pager: [phone]. After hours pager: [phone].

## 2015-03-26 NOTE — Progress Notes (Signed)
CRITICAL VALUE ALERT  Critical value received:  Lactic 2.4  Date of notification:  03/26/15  Time of notification: 0507  Critical value read back:Yes.    Nurse who received alert:   Deniece ReeMary Jyl [place]  MD notified:  Delton CoombesByrum  Time MD notified: [phone]

## 2015-03-27 ENCOUNTER — Inpatient Hospital Stay (HOSPITAL_COMMUNITY): Payer: Medicaid Other

## 2015-03-27 DIAGNOSIS — J9600 Acute respiratory failure, unspecified whether with hypoxia or hypercapnia: Secondary | ICD-10-CM | POA: Insufficient documentation

## 2015-03-27 LAB — CBC
HCT: 30.6 % — ABNORMAL LOW (ref 36.0–46.0)
HEMOGLOBIN: 10.6 g/dL — AB (ref 12.0–15.0)
MCH: 31.3 pg (ref 26.0–34.0)
MCHC: 34.6 g/dL (ref 30.0–36.0)
MCV: 90.3 fL (ref 78.0–100.0)
Platelets: 101 10*3/uL — ABNORMAL LOW (ref 150–400)
RBC: 3.39 MIL/uL — ABNORMAL LOW (ref 3.87–5.11)
RDW: 13.5 % (ref 11.5–15.5)
WBC: 13 10*3/uL — ABNORMAL HIGH (ref 4.0–10.5)

## 2015-03-27 LAB — PHOSPHORUS: Phosphorus: 1.6 mg/dL — ABNORMAL LOW (ref 2.5–4.6)

## 2015-03-27 LAB — BASIC METABOLIC PANEL
Anion gap: 4 — ABNORMAL LOW (ref 5–15)
BUN: 20 mg/dL (ref 6–20)
CO2: 19 mmol/L — ABNORMAL LOW (ref 22–32)
CREATININE: 0.78 mg/dL (ref 0.44–1.00)
Calcium: 7.1 mg/dL — ABNORMAL LOW (ref 8.9–10.3)
Chloride: 110 mmol/L (ref 101–111)
GFR calc Af Amer: >60 mL/min (ref >60)
GFR calc non Af Amer: >60 mL/min (ref >60)
Glucose, Bld: 144 mg/dL — ABNORMAL HIGH (ref 65–99)
Potassium: 4.1 mmol/L (ref 3.5–5.1)
SODIUM: 133 mmol/L — AB (ref 135–145)

## 2015-03-27 LAB — BLOOD GAS, ARTERIAL
ACID-BASE DEFICIT: 7.4 mmol/L — AB (ref 0.0–2.0)
Bicarbonate: 16.4 mEq/L — ABNORMAL LOW (ref 20.0–24.0)
Drawn by: 441381
FIO2: 60
LHR: 16 {breaths}/min
MECHVT: 450 mL
O2 SAT: 99.4 %
PATIENT TEMPERATURE: 98.6
PCO2 ART: 26.9 mmHg — AB (ref 35.0–45.0)
PEEP/CPAP: 10 cmH2O
PH ART: 7.403 (ref 7.350–7.450)
PO2 ART: 167 mmHg — AB (ref 80.0–100.0)
TCO2: 17.2 mmol/L (ref 0–100)

## 2015-03-27 LAB — HEMOGLOBIN A1C
HEMOGLOBIN A1C: 5.1 % (ref 4.8–5.6)
MEAN PLASMA GLUCOSE: 100 mg/dL

## 2015-03-27 LAB — GLUCOSE, CAPILLARY
GLUCOSE-CAPILLARY: 125 mg/dL — AB (ref 65–99)
GLUCOSE-CAPILLARY: 105 mg/dL — AB (ref 65–99)
Glucose-Capillary: 121 mg/dL — ABNORMAL HIGH (ref 65–99)
Glucose-Capillary: 146 mg/dL — ABNORMAL HIGH (ref 65–99)
Glucose-Capillary: 160 mg/dL — ABNORMAL HIGH (ref 65–99)
Glucose-Capillary: 175 mg/dL — ABNORMAL HIGH (ref 65–99)

## 2015-03-27 LAB — PROCALCITONIN: Procalcitonin: 4.7 ng/mL

## 2015-03-27 LAB — MAGNESIUM: MAGNESIUM: 2.1 mg/dL (ref 1.7–2.4)

## 2015-03-27 NOTE — Progress Notes (Signed)
PULMONARY / CRITICAL CARE MEDICINE   Name: Mackenzie Mcdonald MRN: [id number] DOB: 1965/12/21    ADMISSION DATE:  03/13/2015 CONSULTATION DATE:  03/30/2015  REFERRING MD :  EDP  CHIEF COMPLAINT:  Cardiac Arrest  INITIAL PRESENTATION:  49 y.o. F from group home (due to mental retardation) [facility] after she suffered PEA arrest from choking on a big piece of steak.  ROSC achieved in roughly 30 minutes.  PCCM called for admission.    STUDIES:  CXR 10/20 >> ETT in place.  Perihilar heterogenous airspace opacities L > R, worrisome for edema vs aspiration. EEG 10/21 >> severe diffuse cerebral dysfunction that is non-specific in etiology, no seizures seen in study  SIGNIFICANT EVENTS: 10/20 - admitted after PEA arrest.   SUBJECTIVE:  RN reports no acute events.  Remains on vent.  EEG noted.   VITAL SIGNS: Temp:  [99 F (37.2 C)-101.3 F (38.5 C)] 99 F (37.2 C) (10/22 0718) Pulse Rate:  [94-118] 94 (10/22 0718) Resp:  [15-36] 15 (10/22 0718) BP: (79-160)/(56-132) 111/57 mmHg (10/22 0500) SpO2:  [84 %-100 %] 100 % (10/22 0718) Arterial Line BP: (81-134)/(45-90) 119/57 mmHg (10/22 0700) FiO2 (%):  [40 %-80 %] 40 % (10/22 0718) Weight:  [204 lb 12.9 oz (92.9 kg)] 204 lb 12.9 oz (92.9 kg) (10/22 0436)   HEMODYNAMICS: CVP:  [7 mmHg-12 mmHg] 10 mmHg   VENTILATOR SETTINGS: Vent Mode:  [-] PRVC FiO2 (%):  [40 %-80 %] 40 % Set Rate:  [16 bmp] 16 bmp Vt Set:  [450 mL] 450 mL PEEP:  [8 cmH20-10 cmH20] 8 cmH20 Plateau Pressure:  [23 cmH20-27 cmH20] 26 cmH20   INTAKE / OUTPUT: Intake/Output      10/21 0701 - 10/22 0700 10/22 0701 - 10/23 0700   I.V. (mL/kg) 2409.4 (25.9)    NG/GT 806.5    IV Piggyback 717.1    Total Intake(mL/kg) 3933 (42.3)    Urine (mL/kg/hr) 1490 (0.7)    Total Output 1490     Net +2443            PHYSICAL EXAMINATION: General: Young female, unresponsive, occasional myoclonic movments. Neuro: Unresponsive despite no sedation.  Absent corneal reflex,  no gag and no pupillary, eyes deviated down and to the right HEENT: [place]/AT. Pupils dilated bilaterally, non-responsive. Cardiovascular: RRR, no M/R/G.  Lungs: Respirations even and unlabored, lungs bilaterally coarse Abdomen: BS x 4, soft, ND.  Musculoskeletal: No gross deformities, trace generalized edema.  Skin: Intact, warm, no rashes.  LABS:  CBC  Recent Labs Lab 03/17/2015 2003 03/26/15 0417 03/27/15 0425  WBC 18.9* 11.0* 13.0*  HGB 11.8* 12.5 10.6*  HCT 35.5* 37.2 30.6*  PLT 137* 125* 101*   Coag's No results for input(s): APTT, INR in the last 168 hours.   BMET  Recent Labs Lab 03/29/2015 2003 03/26/15 0417 03/27/15 0425  NA 138 138 133*  K 3.3* 2.8* 4.1  CL 109 112* 110  CO2 15* 20* 19*  BUN 15 15 20   CREATININE 1.20* 0.84 0.78  GLUCOSE 264* 145* 144*   Electrolytes  Recent Labs Lab 03/14/2015 2003 03/26/15 0417 03/27/15 0425  CALCIUM 7.4* 6.9* 7.1*  MG 1.8 1.2* 2.1  PHOS  --  2.0* 1.6*   Sepsis Markers  Recent Labs Lab 03/27/2015 2130 04/03/2015 2153 03/26/15 0056 03/26/15 0410 03/26/15 0417 03/27/15 0425  LATICACIDVEN  --  4.78* 1.5 2.4*  --   --   PROCALCITON <0.10  --   --   --  0.86 4.70   ABG  Recent Labs Lab 03/27/2015 2200 03/26/15 0400 03/27/15 0535  PHART 7.206* 7.263* 7.403  PCO2ART 39.0 39.9 26.9*  PO2ART 45.0* 53.3* 167*   Liver Enzymes  Recent Labs Lab 03/17/2015 2003  AST 194*  ALT 244*  ALKPHOS 97  BILITOT 0.2*  ALBUMIN 2.8*   Cardiac Enzymes  Recent Labs Lab 03/15/2015 2130 03/26/15 1053  TROPONINI <0.03 0.13*   Glucose  Recent Labs Lab 03/26/15 0738 03/26/15 1117 03/26/15 1703 03/26/15 2015 03/27/15 0009 03/27/15 0425  GLUCAP 85 118* 140* 164* 160* 175*    Imaging Ct Head Wo Contrast  03/26/2015  CLINICAL DATA:  Choked while eating knee yesterday. This resulted in PEA. Anoxic encephalopathy. EXAM: CT HEAD WITHOUT CONTRAST TECHNIQUE: Contiguous axial images were obtained from the base of the skull  through the vertex without intravenous contrast. COMPARISON:  None. FINDINGS: An endotracheal tube is in place. The ventricles are in the midline without mass effect or shift. No extra-axial fluid collections are identified. The gray-white differentiation is maintained. No findings for diffuse cerebral edema or intracranial hemorrhage. No findings for hemispheric infarction. The bony calvarium is intact. No skull fracture. The paranasal sinuses and mastoid air cells are grossly clear. The globes are intact. IMPRESSION: No acute intracranial findings. Electronically Signed   By: Rudie Meyer M.D.   On: 03/26/2015 14:53    ASSESSMENT / PLAN:  CARDIOVASCULAR CVL pending 10/20 >> A:  S/p PEA arrest after asphyxiating on piece of steak 10/20 - ROSC achieved in ~ 30 minutes. Hypotension post arrest - ? Cardiogenic shock. P:  Levophed as needed for goal MAP > 65 Goal CVP 8 - 12. ICU monitoring   PULMONARY OETT 10/20 >> A: VDRF following PEA arrest after asphyixiation 10/20. Aspiration PNA. P:   MV support, 8 cc/kg Wean PEEP/FiO2 for saturations > 92% VAP prevention measures. Trend intermittent CXR ABG PRN  RENAL A:   Hypokalemia. AG metabolic acidosis - lactate. AKI. Hyponatremia  Hypocalcemia - corrects to 8.36. P:   Trend BMP / UOP  NS @ 125, consider decrease in am 10/23 Replace electrolytes as indicated   GASTROINTESTINAL A:   GI prophylaxis. Nutrition. P:   SUP: Pantoprazole. TF per nutrition.  HEMATOLOGIC A:   Mild anemia. Thrombocytopenia. VTE Prophylaxis. P:  Transfuse for Hgb < 7. Monitor platelet counts / bleeding  SCD's / Heparin. Trend CBC.  INFECTIOUS A:   Aspiration PNA. P:   Sputum Cx 10/20 > not done  Abx: Zosyn, start date 10/20, day 3/x. PCT algorithm to limit abx exposure.  ENDOCRINE A:   Hyperglycemia - hgb a1c 5.1 on admit P:   SSI.  NEUROLOGIC A:   Acute metabolic encephalopathy. Hx mental retardation, autism, bipolar  disorder, ? Seizure disorder (on meds). Patient is posturing and myoclonic with severe shock, no reasonable chance at any meaningful recovery. P:   D/C all sedation. Continue outpatient lamotrigine, topiramate. Check lamotrigine, topiramate levels 10/22. Consider neuro consult pending conversation with guardian. Hold outpatient clonazepam, escitalopram, olanzapine.  Family updated: RN from group home at bedside.  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  Her CSW is Social research officer, government (phone #: [phone]).  Pt with profound autism / mental disorder and functional status at baseline not great.  She did participate in group activities but required 1+ assistance at minimum.  Due to her history and functional status at baseline combined with ~ 30 minutes downtime (potentially more), she is not a hypothermia candidate.  Conversation 10/21 with guardian - DNR paper work instituted and DNR initiated.    Interdisciplinary Family Meeting v Palliative Care Meeting:  Due by: 10/26.   Canary Brim, NP-C [facility] Pgr: [phone] or if no answer [phone] 03/27/2015, 9:15 AM

## 2015-03-28 ENCOUNTER — Inpatient Hospital Stay (HOSPITAL_COMMUNITY): Payer: Medicaid Other

## 2015-03-28 LAB — GLUCOSE, CAPILLARY
GLUCOSE-CAPILLARY: 151 mg/dL — AB (ref 65–99)
GLUCOSE-CAPILLARY: 127 mg/dL — AB (ref 65–99)
GLUCOSE-CAPILLARY: 103 mg/dL — AB (ref 65–99)
Glucose-Capillary: 114 mg/dL — ABNORMAL HIGH (ref 65–99)
Glucose-Capillary: 129 mg/dL — ABNORMAL HIGH (ref 65–99)

## 2015-03-28 LAB — BASIC METABOLIC PANEL
Anion gap: 4 — ABNORMAL LOW (ref 5–15)
BUN: 18 mg/dL (ref 6–20)
CHLORIDE: 110 mmol/L (ref 101–111)
CO2: 22 mmol/L (ref 22–32)
CREATININE: 0.68 mg/dL (ref 0.44–1.00)
Calcium: 7.8 mg/dL — ABNORMAL LOW (ref 8.9–10.3)
GFR calc Af Amer: >60 mL/min (ref >60)
GFR calc non Af Amer: >60 mL/min (ref >60)
GLUCOSE: 116 mg/dL — AB (ref 65–99)
Potassium: 3.6 mmol/L (ref 3.5–5.1)
SODIUM: 136 mmol/L (ref 135–145)

## 2015-03-28 LAB — CBC
HEMATOCRIT: 27.7 % — AB (ref 36.0–46.0)
HEMOGLOBIN: 9.4 g/dL — AB (ref 12.0–15.0)
MCH: 30.6 pg (ref 26.0–34.0)
MCHC: 33.9 g/dL (ref 30.0–36.0)
MCV: 90.2 fL (ref 78.0–100.0)
Platelets: 91 10*3/uL — ABNORMAL LOW (ref 150–400)
RBC: 3.07 MIL/uL — ABNORMAL LOW (ref 3.87–5.11)
RDW: 13.5 % (ref 11.5–15.5)
WBC: 10.8 10*3/uL — ABNORMAL HIGH (ref 4.0–10.5)

## 2015-03-28 LAB — CALCIUM, IONIZED: CALCIUM, IONIZED, SERUM: 4.1 mg/dL — AB (ref 4.5–5.6)

## 2015-03-28 LAB — MAGNESIUM: Magnesium: 1.8 mg/dL (ref 1.7–2.4)

## 2015-03-28 MED ORDER — POTASSIUM CHLORIDE 20 MEQ/15ML (10%) PO SOLN
20.0000 meq | Freq: Once | ORAL | Status: AC
  Administered 2015-03-28: 20 meq
  Filled 2015-03-28: qty 15

## 2015-03-28 MED ORDER — LAMOTRIGINE 25 MG PO TABS
50.0000 mg | ORAL_TABLET | Freq: Two times a day (BID) | ORAL | Status: DC
  Administered 2015-03-28 – 2015-03-30 (×5): 50 mg via ORAL
  Filled 2015-03-28 (×9): qty 2

## 2015-03-28 MED ORDER — TOPIRAMATE 100 MG PO TABS
100.0000 mg | ORAL_TABLET | Freq: Two times a day (BID) | ORAL | Status: DC
  Administered 2015-03-28 – 2015-03-30 (×5): 100 mg via ORAL
  Filled 2015-03-28 (×8): qty 1

## 2015-03-28 NOTE — Progress Notes (Signed)
ANTIBIOTIC CONSULT NOTE - INITIAL  Pharmacy Consult for Zosyn Indication: aspiration pneumonia  Allergies  Allergen Reactions  . Bee Venom Anaphylaxis  . Benadryl [Diphenhydramine Hcl] Other (See Comments)    unknown  . Haldol [Haloperidol Lactate] Other (See Comments)    unknown  . Lithium Other (See Comments)    unknown  . Other Other (See Comments)    Anthistamines   . Phenothiazines Other (See Comments)    Unknown   . Tegretol [Carbamazepine] Other (See Comments)    unknown    Patient Measurements: Height: 5\' 5"  (165.1 cm) Weight: 203 lb 4.2 oz (92.2 kg) IBW/kg (Calculated) : 57  Vital Signs: Temp: 99.9 F (37.7 C) (10/23 1300) Temp Source: Core (Comment) (10/23 1200) Pulse Rate: 111 (10/23 1300) Intake/Output from previous day: 10/22 0701 - 10/23 0700 In: 2459.8 [I.V.:1239.8; NG/GT:1070; IV Piggyback:150] Out: 1575 [Urine:1575] Intake/Output from this shift: Total I/O In: 602.5 [I.V.:62.5; NG/GT:515; IV Piggyback:25] Out: 500 [Urine:500]  Labs:  Recent Labs  03/26/15 0417 03/27/15 0425 03/28/15 0345  WBC 11.0* 13.0* 10.8*  HGB 12.5 10.6* 9.4*  PLT 125* 101* 91*  CREATININE 0.84 0.78 0.68   Estimated Creatinine Clearance: 96.5 mL/min (by C-G formula based on Cr of 0.68). No results for input(s): VANCOTROUGH, VANCOPEAK, VANCORANDOM, GENTTROUGH, GENTPEAK, GENTRANDOM, TOBRATROUGH, TOBRAPEAK, TOBRARND, AMIKACINPEAK, AMIKACINTROU, AMIKACIN in the last 72 hours.   Microbiology: Recent Results (from the past 720 hour(s))  MRSA PCR Screening     Status: None   Collection Time: 03/26/15  2:38 AM  Result Value Ref Range Status   MRSA by PCR NEGATIVE NEGATIVE Final    Comment:        The GeneXpert MRSA Assay (FDA approved for NASAL specimens only), is one component of a comprehensive MRSA colonization surveillance program. It is not intended to diagnose MRSA infection nor to guide or monitor treatment for MRSA infections.     Medical History: Past  Medical History  Diagnosis Date  . Mental disorder     autism  profound  . Blood dyscrasia     thrombocytopenia  . Bipolar 1 disorder   . 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 Urinary incontinence     Medications:  Prescriptions prior to admission  Medication Sig Dispense Refill Last Dose  . chlorhexidine (PERIDEX) 0.12 % solution Use as directed 5 mLs in the mouth or throat every evening. Brush 5 cc of solution on teeth and gums with a toothbrush after pm mouth care.   03/24/2015 at Unknown time  . Cholecalciferol (VITAMIN D3) 2000 UNITS TABS Take 4,000 Units by mouth daily.    06-03-15 at Unknown time  . clonazePAM (KLONOPIN) 1 MG tablet Take 1 mg by mouth 2 (two) times daily.   06-03-15 at Unknown time  . EPINEPHrine (EPIPEN) 0.3 mg/0.3 mL SOAJ injection Inject 0.3 mg into the muscle as needed (for allergic reaction to bee stings).   unknown  . escitalopram (LEXAPRO) 20 MG tablet Take 20 mg by mouth daily.   06-03-15 at Unknown time  . lamoTRIgine (LAMICTAL) 25 MG tablet Take 50 mg by mouth 2 (two) times daily.   06-03-15 at Unknown time  . mupirocin ointment (BACTROBAN) 2 % Apply 1 application topically 3 (three) times daily. Apply to picks on arms and legs   06-03-15 at Unknown time  . OLANZapine (ZYPREXA) 10 MG tablet Take 10 mg by mouth 2 (two) times daily.   06-03-15 at Unknown time  .  topiramate (TOPAMAX) 100 MG tablet Take 100 mg by mouth 2 (two) times daily.   04-21-15 at Unknown time   Assessment: 41 yoF w/ developmental delays residing in a group home who presents to ED on 2015-04-21 after choking during dinner. Abdominal thrusts performed, but did not dislodge food, pt became unresponsive and apneic. Pt had been unconscious for ~15 min when EMS arrived and she was found to be in PEA. Food was dislodged by EMS and ROSC achieved ~30 min post arrest. Pharmacy consulted to dose Zosyn for aspiration, now on day #4. Tmax 100.8, WBC trend down to 10.8. SCr  stable at 0.68 (CrCl ~96 ml/min).  10/20 Zosyn >>  10/20 Trach aspirate >>  Goal of Therapy:  Resolution of infection, clinical improvement  Plan:  Continue Zosyn 3.375 gm IV q8h  Expected duration 7 days with resolution of temperature and/or normalization of WBC Follow up culture results, LOT, clinical improvement  Yohana Bartha K. Bonnye Fava, PharmD, BCPS, CPP Clinical Pharmacist Pager: [phone] Phone: [phone] 03/28/2015 1:27 PM

## 2015-03-28 NOTE — Progress Notes (Signed)
PULMONARY / CRITICAL CARE MEDICINE   Name: Mackenzie Mcdonald MRN: [id number] DOB: 11-09-1965    ADMISSION DATE:  05/29/15 CONSULTATION DATE:  05-10-2015  REFERRING MD :  EDP  CHIEF COMPLAINT:  Cardiac Arrest  INITIAL PRESENTATION:  49 y.o. F from group home (due to mental retardation) [facility] after she suffered PEA arrest from choking on a big piece of steak.  ROSC achieved in roughly 30 minutes.  PCCM called for admission.    STUDIES:  CXR 10/20 >> ETT in place.  Perihilar heterogenous airspace opacities L > R, worrisome for edema vs aspiration. EEG 10/21 >> severe diffuse cerebral dysfunction that is non-specific in etiology, no seizures seen in study  SIGNIFICANT EVENTS: 10/20 - admitted after PEA arrest.   SUBJECTIVE:  RN reports occasional twitching of R eye.  No other acute events.   VITAL SIGNS: Temp:  [97.9 F (36.6 C)-100.8 F (38.2 C)] 100 F (37.8 C) (10/23 0713) Pulse Rate:  [94-109] 97 (10/23 0713) Resp:  [19-36] 20 (10/23 0713) BP: (117-125)/(59-71) 117/71 mmHg (10/22 2000) SpO2:  [98 %-100 %] 98 % (10/23 0713) Arterial Line BP: (101-145)/(47-76) 129/60 mmHg (10/23 0700) FiO2 (%):  [40 %] 40 % (10/23 0713) Weight:  [203 lb 4.2 oz (92.2 kg)] 203 lb 4.2 oz (92.2 kg) (10/23 0343)   HEMODYNAMICS: CVP:  [7 mmHg-10 mmHg] 7 mmHg   VENTILATOR SETTINGS: Vent Mode:  [-] PRVC FiO2 (%):  [40 %] 40 % Set Rate:  [16 bmp] 16 bmp Vt Set:  [450 mL] 450 mL PEEP:  [6 cmH20-8 cmH20] 6 cmH20 Plateau Pressure:  [18 cmH20-26 cmH20] 20 cmH20   INTAKE / OUTPUT: Intake/Output      10/22 0701 - 10/23 0700 10/23 0701 - 10/24 0700   I.V. (mL/kg) 1239.8 (13.4)    NG/GT 1070    IV Piggyback 150    Total Intake(mL/kg) 2459.8 (26.7)    Urine (mL/kg/hr) 1575 (0.7)    Stool 0 (0)    Total Output 1575     Net +884.8          Stool Occurrence 3 x      PHYSICAL EXAMINATION: General: Young female, unresponsive, occasional myoclonic movments. Neuro: Unresponsive  despite no sedation.  Absent corneal reflex, no gag and no pupillary, eyes deviated down and to the right HEENT: [place]/AT. Pupils dilated bilaterally, non-responsive. Cardiovascular: RRR, no M/R/G.  Lungs: Respirations even and unlabored, lungs bilaterally coarse Abdomen: BS x 4, soft, ND.  Musculoskeletal: No gross deformities, trace generalized edema.  Skin: Intact, warm, no rashes.  LABS:  CBC  Recent Labs Lab 03/26/15 0417 03/27/15 0425 03/28/15 0345  WBC 11.0* 13.0* 10.8*  HGB 12.5 10.6* 9.4*  HCT 37.2 30.6* 27.7*  PLT 125* 101* 91*   Coag's No results for input(s): APTT, INR in the last 168 hours.   BMET  Recent Labs Lab 03/26/15 0417 03/27/15 0425 03/28/15 0345  NA 138 133* 136  K 2.8* 4.1 3.6  CL 112* 110 110  CO2 20* 19* 22  BUN 15 20 18   CREATININE 0.84 0.78 0.68  GLUCOSE 145* 144* 116*   Electrolytes  Recent Labs Lab 03/26/15 0417 03/27/15 0425 03/28/15 0345  CALCIUM 6.9* 7.1* 7.8*  MG 1.2* 2.1 1.8  PHOS 2.0* 1.6*  --    Sepsis Markers  Recent Labs Lab 05-10-2015 2130 05-10-2015 2153 03/26/15 0056 03/26/15 0410 03/26/15 0417 03/27/15 0425  LATICACIDVEN  --  4.78* 1.5 2.4*  --   --  PROCALCITON <0.10  --   --   --  0.86 4.70   ABG  Recent Labs Lab 04-05-15 2200 03/26/15 0400 03/27/15 0535  PHART 7.206* 7.263* 7.403  PCO2ART 39.0 39.9 26.9*  PO2ART 45.0* 53.3* 167*   Liver Enzymes  Recent Labs Lab 04/05/15 2003  AST 194*  ALT 244*  ALKPHOS 97  BILITOT 0.2*  ALBUMIN 2.8*   Cardiac Enzymes  Recent Labs Lab April 05, 2015 2130 03/26/15 1053  TROPONINI <0.03 0.13*   Glucose  Recent Labs Lab 03/27/15 0914 03/27/15 1226 03/27/15 1547 03/27/15 2007 03/27/15 2331 03/28/15 0341  GLUCAP 146* 121* 125* 105* 129* 114*    Imaging Dg Chest Port 1 View  03/28/2015  CLINICAL DATA:  Acute respiratory failure EXAM: PORTABLE CHEST 1 VIEW COMPARISON:  Portable exam 0440 hours compared to 03/27/2015 FINDINGS: Tip of endotracheal  tube projects 4.4 cm above carina. Nasogastric tube coiled in stomach. RIGHT jugular central venous catheter tip projects over cavoatrial junction. Normal heart size, mediastinal contours and pulmonary vascularity. Persistent consolidation LEFT lower lobe. Mild atelectasis versus infiltrate at RIGHT base. Upper lungs clear. No pleural effusion or pneumothorax. IMPRESSION: Persistent atelectasis versus consolidation LEFT lower lobe. Electronically Signed   By: Ulyses Southward M.D.   On: 03/28/2015 07:22    ASSESSMENT / PLAN:  CARDIOVASCULAR CVL 10/20 >> A:  S/p PEA arrest after asphyxiating on piece of steak 10/20 - ROSC achieved in ~ 30 minutes. Hypotension post arrest - ? Cardiogenic shock. P:  Monitor hemodynamics  DNR   PULMONARY OETT 10/20 >> A: VDRF following PEA arrest after asphyixiation 10/20. Aspiration PNA. LLL Airspace Disease  P:   MV support, 8 cc/kg Wean PEEP/FiO2 for saturations > 92% VAP prevention measures. Trend intermittent CXR ABG PRN  RENAL A:   Hypokalemia. AG metabolic acidosis - lactate. AKI. Hyponatremia  Hypocalcemia - corrects to 8.36. P:   Trend BMP / UOP  NS to [facility]  Replace electrolytes as indicated   GASTROINTESTINAL A:   GI prophylaxis. Nutrition. P:   SUP: Pantoprazole. TF per nutrition.  HEMATOLOGIC A:   Mild anemia. Thrombocytopenia. VTE Prophylaxis. P:  Transfuse for Hgb < 7. Monitor platelet counts / bleeding  SCD's / Heparin. Trend CBC.  INFECTIOUS A:   Aspiration PNA. P:   Abx: Zosyn, start date 10/20, day 4/x. PCT algorithm to limit abx exposure.  ENDOCRINE A:   Hyperglycemia - hgb a1c 5.1 on admit P:   SSI.  NEUROLOGIC A:   Acute metabolic encephalopathy. Hx mental retardation, autism, bipolar disorder, ? Seizure disorder (on meds). Patient is posturing and myoclonic with severe shock, no reasonable chance at any meaningful recovery. P:   D/C all sedation. Resume outpatient lamotrigine,  topiramate. Follow up lamotrigine, topiramate levels 10/22>> Hold outpatient clonazepam, escitalopram, olanzapine.  Family updated: RN from group home at bedside.  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  Her CSW is Social research officer, government (phone #: [phone]).  Pt with profound autism / mental disorder and functional status at baseline not great.  She did participate in group activities but required 1+ assistance at minimum.  Due to her history and functional status at baseline combined with ~ 30 minutes downtime (potentially more), she is not a hypothermia candidate.  Conversation 10/21 with guardian - DNR paper work instituted and DNR initiated.    Interdisciplinary Family Meeting v Palliative Care Meeting:  Due by: 10/26.   Canary Brim, NP-C [facility] Pgr: [phone] or if no  answer [phone] 03/28/2015, 8:07 AM

## 2015-03-29 DIAGNOSIS — Z452 Encounter for adjustment and management of vascular access device: Secondary | ICD-10-CM

## 2015-03-29 LAB — GLUCOSE, CAPILLARY
GLUCOSE-CAPILLARY: 104 mg/dL — AB (ref 65–99)
GLUCOSE-CAPILLARY: 135 mg/dL — AB (ref 65–99)
GLUCOSE-CAPILLARY: 120 mg/dL — AB (ref 65–99)
Glucose-Capillary: 112 mg/dL — ABNORMAL HIGH (ref 65–99)
Glucose-Capillary: 116 mg/dL — ABNORMAL HIGH (ref 65–99)
Glucose-Capillary: 116 mg/dL — ABNORMAL HIGH (ref 65–99)
Glucose-Capillary: 124 mg/dL — ABNORMAL HIGH (ref 65–99)
Glucose-Capillary: 136 mg/dL — ABNORMAL HIGH (ref 65–99)
Glucose-Capillary: 146 mg/dL — ABNORMAL HIGH (ref 65–99)

## 2015-03-29 LAB — BASIC METABOLIC PANEL
ANION GAP: 9 (ref 5–15)
BUN: 16 mg/dL (ref 6–20)
CHLORIDE: 108 mmol/L (ref 101–111)
CO2: 20 mmol/L — AB (ref 22–32)
CREATININE: 0.56 mg/dL (ref 0.44–1.00)
Calcium: 8.1 mg/dL — ABNORMAL LOW (ref 8.9–10.3)
GFR calc non Af Amer: >60 mL/min (ref >60)
Glucose, Bld: 137 mg/dL — ABNORMAL HIGH (ref 65–99)
POTASSIUM: 3.5 mmol/L (ref 3.5–5.1)
Sodium: 137 mmol/L (ref 135–145)

## 2015-03-29 LAB — C DIFFICILE QUICK SCREEN W PCR REFLEX
C DIFFICILE (CDIFF) INTERP: NEGATIVE
C DIFFICILE (CDIFF) TOXIN: NEGATIVE
C DIFFICLE (CDIFF) ANTIGEN: NEGATIVE

## 2015-03-29 LAB — CBC
HCT: 28.1 % — ABNORMAL LOW (ref 36.0–46.0)
HEMOGLOBIN: 9.9 g/dL — AB (ref 12.0–15.0)
MCH: 31.7 pg (ref 26.0–34.0)
MCHC: 35.2 g/dL (ref 30.0–36.0)
MCV: 90.1 fL (ref 78.0–100.0)
PLATELETS: 95 10*3/uL — AB (ref 150–400)
RBC: 3.12 MIL/uL — AB (ref 3.87–5.11)
RDW: 13.4 % (ref 11.5–15.5)
WBC: 9.4 10*3/uL (ref 4.0–10.5)

## 2015-03-29 LAB — TOPIRAMATE LEVEL: Topiramate Lvl: 3.6 ug/mL (ref 2.0–25.0)

## 2015-03-29 MED ORDER — PANTOPRAZOLE SODIUM 40 MG PO PACK
40.0000 mg | PACK | Freq: Every day | ORAL | Status: DC
  Administered 2015-03-29 – 2015-03-30 (×2): 40 mg
  Filled 2015-03-29 (×2): qty 20

## 2015-03-29 NOTE — Progress Notes (Signed)
PULMONARY / CRITICAL CARE MEDICINE   Name: Kathrynn Speedracy Ziemann MRN: [id number] DOB: 12-03-1965    ADMISSION DATE:  12/21/14 CONSULTATION DATE:  2014-08-24  REFERRING MD :  EDP  CHIEF COMPLAINT:  Cardiac Arrest  INITIAL PRESENTATION:  49 y.o. F from group home (due to mental retardation) [facility] after she suffered PEA arrest from choking on a big piece of steak.  ROSC achieved in roughly 30 minutes.  PCCM called for admission.    STUDIES:  CXR 10/20 >> ETT in place.  Perihilar heterogenous airspace opacities L > R, worrisome for edema vs aspiration. EEG 10/21 >> severe diffuse cerebral dysfunction that is non-specific in etiology, no seizures seen in study  SIGNIFICANT EVENTS: 10/20 - admitted after PEA arrest. 10/24- no improvement in neurostatus   SUBJECTIVE:  Pupils react  VITAL SIGNS: Temp:  [98.1 F (36.7 C)-100.8 F (38.2 C)] 100 F (37.8 C) (10/24 0900) Pulse Rate:  [80-111] 111 (10/24 0900) Resp:  [16-36] 28 (10/24 0900) BP: (139)/(78) 139/78 mmHg (10/24 0811) SpO2:  [90 %-100 %] 99 % (10/24 0900) Arterial Line BP: (110-176)/(50-84) 140/84 mmHg (10/24 0900) FiO2 (%):  [30 %-40 %] 30 % (10/24 0800) Weight:  [91.5 kg (201 lb 11.5 oz)] 91.5 kg (201 lb 11.5 oz) (10/24 0400)   HEMODYNAMICS:     VENTILATOR SETTINGS: Vent Mode:  [-] PRVC FiO2 (%):  [30 %-40 %] 30 % Set Rate:  [16 bmp] 16 bmp Vt Set:  [450 mL] 450 mL PEEP:  [5 cmH20] 5 cmH20 Plateau Pressure:  [18 cmH20-21 cmH20] 19 cmH20   INTAKE / OUTPUT: Intake/Output      10/23 0701 - 10/24 0700 10/24 0701 - 10/25 0700   P.O. 200    I.V. (mL/kg) 222.5 (2.4) 20 (0.2)   NG/GT 1085 60   IV Piggyback 150    Total Intake(mL/kg) 1657.5 (18.1) 80 (0.9)   Urine (mL/kg/hr) 1745 (0.8) 750 (3.2)   Stool 0 (0)    Total Output 1745 750   Net -87.5 -670        Stool Occurrence 4 x      PHYSICAL EXAMINATION: General: Young female, unresponsive Neuro: Unresponsive despite no sedation.  Absent corneal  reflex, no gag and has some pupillary response, eyes deviated down and to the right HEENT: [place]/AT Cardiovascular: RRR, no M/R/G.  Lungs: bilaterally coarse to clear after suction Abdomen: BS x 4, soft, ND.  Musculoskeletal: No gross deformities, trace generalized edema.  Skin: Intact, warm, no rashes.  LABS:  CBC  Recent Labs Lab 03/27/15 0425 03/28/15 0345 03/29/15 0415  WBC 13.0* 10.8* 9.4  HGB 10.6* 9.4* 9.9*  HCT 30.6* 27.7* 28.1*  PLT 101* 91* 95*   Coag's No results for input(s): APTT, INR in the last 168 hours.   BMET  Recent Labs Lab 03/27/15 0425 03/28/15 0345 03/29/15 0415  NA 133* 136 137  K 4.1 3.6 3.5  CL 110 110 108  CO2 19* 22 20*  BUN 20 18 16   CREATININE 0.78 0.68 0.56  GLUCOSE 144* 116* 137*   Electrolytes  Recent Labs Lab 03/26/15 0417 03/27/15 0425 03/28/15 0345 03/29/15 0415  CALCIUM 6.9* 7.1* 7.8* 8.1*  MG 1.2* 2.1 1.8  --   PHOS 2.0* 1.6*  --   --    Sepsis Markers  Recent Labs Lab 2014-08-24 2130 2014-08-24 2153 03/26/15 0056 03/26/15 0410 03/26/15 0417 03/27/15 0425  LATICACIDVEN  --  4.78* 1.5 2.4*  --   --  PROCALCITON <0.10  --   --   --  0.86 4.70   ABG  Recent Labs Lab 04-19-2015 2200 03/26/15 0400 03/27/15 0535  PHART 7.206* 7.263* 7.403  PCO2ART 39.0 39.9 26.9*  PO2ART 45.0* 53.3* 167*   Liver Enzymes  Recent Labs Lab 2015-04-19 2003  AST 194*  ALT 244*  ALKPHOS 97  BILITOT 0.2*  ALBUMIN 2.8*   Cardiac Enzymes  Recent Labs Lab 04-19-2015 2130 03/26/15 1053  TROPONINI <0.03 0.13*   Glucose  Recent Labs Lab 03/28/15 1645 03/28/15 1943 03/28/15 2300 03/29/15 0404 03/29/15 0741 03/29/15 0805  GLUCAP 103* 104* 146* 135* 116* 120*    Imaging No results found.  ASSESSMENT / PLAN:  CARDIOVASCULAR CVL 10/20 >> A:  S/p PEA arrest after asphyxiating on piece of steak 10/20 - ROSC achieved in ~ 30 minutes. Hypotension post arrest - ? Cardiogenic shock. P:  Monitor hemodynamics  DNR   PRESSORS will not change outcome, no escallation Dc aline May need add BB  PULMONARY OETT 10/20 >> A: VDRF following PEA arrest after asphyixiation 10/20. Aspiration PNA. LLL Airspace Disease  P:   Keep same MV SBT attempt, cpap 5 ps 10 No extubation planned Trach would be futile and unethical, will not be offered   RENAL A:   Hypokalemia. AG metabolic acidosis - lactate. AKI. Hyponatremia  Hypocalcemia - corrects to 8.36. P:   Am labs reviewed kvo  GASTROINTESTINAL A:   GI prophylaxis. Nutrition. P:   SUP: Pantoprazole. TF per nutrition Assess for BM  HEMATOLOGIC A:   Mild anemia. Thrombocytopenia. VTE Prophylaxis. P:  Limit phlebotomy SCD's / Heparin.  INFECTIOUS A:   Aspiration PNA P:   Abx: Zosyn, start date 10/20>>> Add stop date  ENDOCRINE A:   Hyperglycemia - hgb a1c 5.1 on admit P:   SSI.  NEUROLOGIC A:   Acute metabolic encephalopathy. Hx mental retardation, autism, bipolar disorder, ? Seizure disorder (on meds). Patient is posturing and myoclonic with severe ANOXIA P:   Resume outpatient lamotrigine, topiramate. Follow up lamotrigine, topiramate levels 10/22>> Hold outpatient clonazepam, escitalopram, olanzapine.  Family updated: RN from group home at bedside.  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  Pt with profound autism / mental disorder and functional status at baseline not great.  She did participate in group activities but required 1+ assistance at minimum.  Due to her history and functional status at baseline combined with ~ 30 minutes downtime (potentially more), she is not a hypothermia candidate.  Conversation 10/21 with guardian - DNR paper work instituted and DNR initiated.    She is suffering on vent, I will call and arrange comfort care  Interdisciplinary Family Meeting v Palliative Care Meeting:  Due by: 10/26.  Ccm time 30 min   Mcarthur Rossetti. Tyson Alias, MD, FACP Pgr:  [phone] [facility]

## 2015-03-29 NOTE — Progress Notes (Signed)
CSW attempted to reach Legal Guardian with [place] [facility]- the name and number listed within Dr. Gwendolyn GrantFeinstein's progress note  is incorrect  Mackenzie Mcdonald( Mackenzie Mcdonald (phone #: [phone]). CSW is attempting to locate correct Case Worker and number for staff as well as to ascertain status of authorization for extubation from ventilator.  CSW will notify MD and staff as soon as correct information is obtained. [facility] to inquire as to who is the current legal guardian for this patient.  [phone]) Mackenzie Leashonna T. Jaci LazierCrowder, [place] [phone]

## 2015-03-29 NOTE — Progress Notes (Signed)
Spoke with Mackenzie Mcdonald, Mackenzie Mcdonald on-call worker re: pt's clinical status/MD concerns.  He will contact Mackenzie Mcdonald [facility], Mackenzie Mcdonald  and will have her to f/u with Mackenzie Mcdonald this evening.  CSW expressed to Mr. Mackenzie Mcdonald that this was an urgent situation and immediate f/u on behalf of Mcdonald is expected.  CSW will continue to follow.  Mackenzie Mcdonald Evening/ED CSW [phone]

## 2015-03-29 NOTE — Progress Notes (Signed)
eLink Physician-Brief Progress Note Patient Name: Kathrynn Speedracy Richner DOB: 1966/04/24 MRN: [id number]   Date of Service  03/29/2015  HPI/Events of Note  D/w [place] [employer]  eICU Interventions  Updated them Recommended comfort care in am  We will meet 10 am likely      Intervention Category Intermediate Interventions: Communication with other healthcare providers and/or family  Nelda BucksFEINSTEIN,DANIEL J. 03/29/2015, 7:49 PM

## 2015-03-29 NOTE — Progress Notes (Signed)
Mackenzie Mcdonald with DSS called this RN for pt status update. Her number is [phone].

## 2015-03-30 ENCOUNTER — Other Ambulatory Visit (HOSPITAL_COMMUNITY): Payer: Medicaid Other

## 2015-03-30 LAB — GLUCOSE, CAPILLARY
GLUCOSE-CAPILLARY: 135 mg/dL — AB (ref 65–99)
Glucose-Capillary: 118 mg/dL — ABNORMAL HIGH (ref 65–99)

## 2015-03-30 LAB — LAMOTRIGINE LEVEL: LAMOTRIGINE LVL: 1.1 ug/mL — AB (ref 2.0–20.0)

## 2015-03-30 MED ORDER — DEXTROSE 5 % IV SOLN
5.0000 mg/h | INTRAVENOUS | Status: DC
  Administered 2015-03-30 (×2): 5 mg/h via INTRAVENOUS
  Filled 2015-03-30 (×2): qty 25

## 2015-03-30 MED ORDER — MORPHINE SULFATE 25 MG/ML IV SOLN
10.0000 mg/h | INTRAVENOUS | Status: DC
  Administered 2015-03-30: 5 mg/h via INTRAVENOUS
  Filled 2015-03-30 (×2): qty 10

## 2015-03-30 MED ORDER — LABETALOL HCL 5 MG/ML IV SOLN: 10.0000 mg | INTRAVENOUS | Status: DC | PRN

## 2015-03-30 MED ORDER — LORAZEPAM 2 MG/ML IJ SOLN
2.0000 mg | INTRAMUSCULAR | Status: DC
  Administered 2015-03-30 (×4): 2 mg via INTRAVENOUS

## 2015-03-30 MED ORDER — LORAZEPAM 2 MG/ML IJ SOLN
INTRAMUSCULAR | Status: AC
  Filled 2015-03-30: qty 1

## 2015-03-30 MED ORDER — PROPOFOL 1000 MG/100ML IV EMUL
5.0000 ug/kg/min | INTRAVENOUS | Status: DC
  Administered 2015-03-30: 10 ug/kg/min via INTRAVENOUS
  Filled 2015-03-30: qty 100

## 2015-03-30 MED ORDER — LORAZEPAM BOLUS VIA INFUSION
2.0000 mg | INTRAVENOUS | Status: DC | PRN
  Filled 2015-03-30: qty 5

## 2015-03-30 MED ORDER — MIDAZOLAM HCL 2 MG/2ML IJ SOLN: 2.0000 mg | INTRAMUSCULAR | Status: DC | PRN

## 2015-03-30 MED ORDER — MORPHINE BOLUS VIA INFUSION
5.0000 mg | INTRAVENOUS | Status: DC | PRN
  Filled 2015-03-30: qty 20

## 2015-03-30 NOTE — Progress Notes (Signed)
Extensive discussion with DSS MRS Earlene Plateravis and Elnita Maxwellheryl. We discussed the poor prognosis and likely poor quality of life. Family has decided to offer full comfort care. They are aware that the patient may be transferred to palliative care floor for continued comfort care needs. They have been fully updated on the process and expectations.  Donor to speak to them  Mcarthur Rossettianiel J. Tyson AliasFeinstein, MD, FACP Pgr: [phone] [facility]

## 2015-03-30 NOTE — Progress Notes (Signed)
Pt's friends, caregivers, and community members at the bedside. RN and RT at bedside. At ~1807 pt extubated. Morphine gtt titrated and bolused per orders for comfort. Will continue to ensure pt is comfortable.

## 2015-03-30 NOTE — Procedures (Signed)
Extubation Procedure Note  Patient Details:   Name: Mackenzie Mcdonald DOB: 11/11/1965 MRN: [id number]   Airway Documentation: Extubated per end of life protocol to roomair.      Evaluation  O2 sats: currently acceptable Complications: No apparent complications Patient did not tolerate procedure well. Bilateral Breath Sounds: Clear, Diminished Suctioning: Airway, Oral No  Richmond CampbellHall, Rashanna Christiana Lynn 03/30/2015, 6:07 PM

## 2015-03-30 NOTE — Progress Notes (Signed)
CSW received a call from [facility]- [place] [facility] who indicated that when patient expires- the nursing staff should contact [employer]  [phone].  Also- please call the on-call DSS worker at [phone] who will notify Ms. Millmore of patient's expiration.  CSW notified patient's nurse of above.  This information also placed in a sticky note to MD/Nursing.  Lorri Frederickonna T. Jaci LazierCrowder, [place] [phone]

## 2015-03-30 NOTE — Progress Notes (Signed)
Responded to spiritual care consult to provide support to patient and staff.  Staff will be withdrawing life support between 5 -6pm today.  Will pass on to on - call night Chaplain for continued support.Patient does not have any family support.  Friend from group home are planning to be presence.

## 2015-03-30 NOTE — Progress Notes (Signed)
CSW spoke this morning with [place]- [facility]. She confirmed that she and Bing NeighborsGenese Davis- [facility] will plan to meet with Dr. Tyson AliasFeinstein this morning at 10:00 am.  Notified patient's nurse of above.  Lorri Frederickonna T. Jaci LazierCrowder, [place] [phone]

## 2015-03-30 NOTE — Progress Notes (Signed)
PULMONARY / CRITICAL CARE MEDICINE   Name: Mackenzie Mcdonald MRN: [id number] DOB: 21-Jan-1966    ADMISSION DATE:  2014-06-16 CONSULTATION DATE:  2014/07/19  REFERRING MD :  EDP  CHIEF COMPLAINT:  Cardiac Arrest  INITIAL PRESENTATION:  49 y.o. F from group home (due to mental retardation) [facility] after she suffered PEA arrest from choking on a big piece of steak.  ROSC achieved in roughly 30 minutes.  PCCM called for admission.    STUDIES:  CXR 10/20 >> ETT in place.  Perihilar heterogenous airspace opacities L > R, worrisome for edema vs aspiration. EEG 10/21 >> severe diffuse cerebral dysfunction that is non-specific in etiology, no seizures seen in study  SIGNIFICANT EVENTS: 10/20 - admitted after PEA arrest. 10/24- no improvement in neurostatus   SUBJECTIVE:  Increased myoclonus  VITAL SIGNS: Temp:  [98 F (36.7 C)-100.8 F (38.2 C)] 98.2 F (36.8 C) (10/25 0800) Pulse Rate:  [79-137] 137 (10/25 0900) Resp:  [11-39] 39 (10/25 0900) BP: (123-172)/(56-130) 157/114 mmHg (10/25 0900) SpO2:  [97 %-100 %] 98 % (10/25 0900) Arterial Line BP: (131)/(62) 131/62 mmHg (10/24 0945) FiO2 (%):  [30 %] 30 % (10/25 0800) Weight:  [88.7 kg (195 lb 8.8 oz)] 88.7 kg (195 lb 8.8 oz) (10/25 0432)   HEMODYNAMICS:     VENTILATOR SETTINGS: Vent Mode:  [-] PRVC FiO2 (%):  [30 %] 30 % Set Rate:  [16 bmp] 16 bmp Vt Set:  [450 mL] 450 mL PEEP:  [5 cmH20] 5 cmH20 Plateau Pressure:  [17 cmH20-21 cmH20] 21 cmH20   INTAKE / OUTPUT: Intake/Output      10/24 0701 - 10/25 0700 10/25 0701 - 10/26 0700   P.O.     I.V. (mL/kg) 219.4 (2.5) 24.4 (0.3)   NG/GT 1100 90   IV Piggyback 137.5    Total Intake(mL/kg) 1456.9 (16.4) 114.4 (1.3)   Urine (mL/kg/hr) 3780 (1.8) 400 (1.9)   Stool 85 (0)    Total Output 3865 400   Net -2408.2 -285.6        Stool Occurrence 1 x      PHYSICAL EXAMINATION: General: Young female, unresponsive Neuro: Unresponsive despite no sedation for days, eyes  open non purposeful, myoclonus, postuing HEENT: ETT Cardiovascular: RRR, no M/R/G.  Lungs: coarse Abdomen: BS x 4, soft, ND.  Musculoskeletal: No gross deformities, trace generalized edema.  Skin: Intact, warm, no rashes.  LABS:  CBC  Recent Labs Lab 03/27/15 0425 03/28/15 0345 03/29/15 0415  WBC 13.0* 10.8* 9.4  HGB 10.6* 9.4* 9.9*  HCT 30.6* 27.7* 28.1*  PLT 101* 91* 95*   Coag's No results for input(s): APTT, INR in the last 168 hours.   BMET  Recent Labs Lab 03/27/15 0425 03/28/15 0345 03/29/15 0415  NA 133* 136 137  K 4.1 3.6 3.5  CL 110 110 108  CO2 19* 22 20*  BUN 20 18 16   CREATININE 0.78 0.68 0.56  GLUCOSE 144* 116* 137*   Electrolytes  Recent Labs Lab 03/26/15 0417 03/27/15 0425 03/28/15 0345 03/29/15 0415  CALCIUM 6.9* 7.1* 7.8* 8.1*  MG 1.2* 2.1 1.8  --   PHOS 2.0* 1.6*  --   --    Sepsis Markers  Recent Labs Lab 2014/07/19 2130 2014/07/19 2153 03/26/15 0056 03/26/15 0410 03/26/15 0417 03/27/15 0425  LATICACIDVEN  --  4.78* 1.5 2.4*  --   --   PROCALCITON <0.10  --   --   --  0.86 4.70   ABG  Recent Labs Lab 03/27/2015 2200 03/26/15 0400 03/27/15 0535  PHART 7.206* 7.263* 7.403  PCO2ART 39.0 39.9 26.9*  PO2ART 45.0* 53.3* 167*   Liver Enzymes  Recent Labs Lab 03/08/2015 2003  AST 194*  ALT 244*  ALKPHOS 97  BILITOT 0.2*  ALBUMIN 2.8*   Cardiac Enzymes  Recent Labs Lab 03/17/2015 2130 03/26/15 1053  TROPONINI <0.03 0.13*   Glucose  Recent Labs Lab 03/29/15 1214 03/29/15 1610 03/29/15 1938 03/29/15 2343 03/30/15 0429 03/30/15 0755  GLUCAP 136* 112* 116* 124* 135* 118*    Imaging No results found.  ASSESSMENT / PLAN:  CARDIOVASCULAR CVL 10/20 >> A:  S/p PEA arrest after asphyxiating on piece of steak 10/20 - ROSC achieved in ~ 30 minutes. Hypotension post arrest - ? Cardiogenic shock. P:  Monitor hemodynamics  DNR  PRESSORS will not change outcome, no escallation Add labetolol  PULMONARY OETT  10/20 >> A: VDRF following PEA arrest after asphyixiation 10/20. Aspiration PNA. LLL Airspace Disease  P:   Keep same MV Meeting with guarding for comfort  Of chosen will assess comfort care needs meds with short weaning  RENAL A:   Hypokalemia. AG metabolic acidosis - lactate. AKI. Hyponatremia  Hypocalcemia - corrects to 8.36. P:   Am labs reviewed kvo Avoid free water  GASTROINTESTINAL A:   GI prophylaxis. Nutrition. P:   SUP: Pantoprazole. TF hold , likely extubation for comfort  HEMATOLOGIC A:   Mild anemia. Thrombocytopenia. VTE Prophylaxis. P:  Limit phlebotomy SCD's / Heparin.  INFECTIOUS A:   Aspiration PNA P:   Abx: Zosyn, start date 10/20>>> Allow to dc today  ENDOCRINE A:   Hyperglycemia - hgb a1c 5.1 on admit P:   SSI.  NEUROLOGIC A:   Acute metabolic encephalopathy. Hx mental retardation, autism, bipolar disorder, ? Seizure disorder (on meds). Patient is posturing and myoclonic with severe ANOXIA P:   lamotrigine, topiramate. Follow up lamotrigine, topiramate levels 10/22>> Hold outpatient clonazepam, escitalopram, olanzapine. Worsening myoclonus, add ativan scheduled  Family updated: RN from group home at bedside.  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  Her CSW is Social research officer, government (phone #: [phone]).  Pt with profound autism / mental disorder and functional status at baseline not great.  She did participate in group activities but required 1+ assistance at minimum.  Due to her history and functional status at baseline combined with ~ 30 minutes downtime (potentially more), she is not a hypothermia candidate.  Conversation 10/21 with guardian - DNR paper work instituted and DNR initiated.    She is suffering on vent, I will call and arrange comfort care  Interdisciplinary Family Meeting v Palliative Care Meeting:  Due by: 10/26.  Meeting today to confider comfort at 10 am   Ccm time 30 min   Mcarthur Rossetti. Tyson Alias, MD, FACP Pgr:  [phone] [facility]

## 2015-03-30 NOTE — Progress Notes (Signed)
eLink Physician-Brief Progress Note Patient Name: Kathrynn Speedracy Rettinger DOB: [birth year] MRN: [id number]   Date of Service  03/30/2015  HPI/Events of Note  Intermittent jerking, myoclonus? Currently on two separate anti-epileptics Becomes tachypneic on vent when this happens  eICU Interventions  Will start low dose propofol EEG again in AM May need neurology consult Holding keppra given other anti-epileptics     Intervention Category Major Interventions: Seizures - evaluation and management  Max FickleDouglas Moya Duan 03/30/2015, 12:19 AM

## 2015-03-30 NOTE — Progress Notes (Signed)
CSW let another urgent message for [facility] [place] [facility] requesting call back to discuss patient's clinical status and need for clarification of DNR status. Evening CSW also spoke with on-call worker last evening and we are attempting to communicate with another worker as well- Bing NeighborsGenese Davis.  This CSW will continue efforts this morning to reach appropriate person at DSS and follow up with Dr. Tyson AliasFeinstein.  Lorri Frederickonna T. Jaci LazierCrowder, [place] [phone]

## 2015-04-06 NOTE — Progress Notes (Signed)
Patient expired early this morning.  CSW left message for [employer]heryl Millmore- Guardianship- [place] [employer].   CSW signing off. Lorri Frederickonna T. Jaci LazierCrowder, [place] [phone]

## 2015-04-06 NOTE — Progress Notes (Signed)
15cc of Ativan gtt wasted in sink. 30mg  of Morphine gtt wasted in sink. Witnessed by Inez Catalinaara RN and Health visitorAmanda RN.

## 2015-04-06 NOTE — Progress Notes (Signed)
   01/30/2015 0700  Clinical Encounter Type  Visited With Patient;Health care provider  Visit Type Follow-up;Psychological support;Spiritual support;Social support;Patient actively dying  Referral From Chaplain;Nurse;Care management  Spiritual Encounters  Spiritual Needs Prayer;Emotional;Grief support  Stress Factors  Family Stress Factors Loss   On call Chaplain responded to spiritual care consult to provide support to patient and staff and friends Staff withdrawn life support at pm. Chaplain facilitated postmortem religious needs. Chaplain helped friends explore their emotions surrounding the Pt. Health.

## 2015-04-06 NOTE — Progress Notes (Signed)
Pt asystole on monitor. 0230 Tara RN and Allstatemanda RN listened for one minute and no heart or breath sounds were auscultated.  .Marland Kitchen

## 2015-04-06 DEATH — deceased

## 2015-05-06 NOTE — Discharge Summary (Signed)
NAMBiagio Borg:  Mccaslin, Saraiya                  ACCOUNT NO.:  [id number]  MEDICAL RECORD NO.:  [id number]  LOCATION:  2H03C                        FACILITY:  [facility]  PHYSICIAN:  Nelda Bucksaniel J Shamiya Demeritt, MD DATE OF BIRTH:  Apr 17, 1966  DATE OF ADMISSION:  06-26-14 DATE OF DISCHARGE:  03/26/2015                              DISCHARGE SUMMARY   DEATH SUMMARY  This is a 49 year old unfortunate female from a group home due to mental retardation, [facility] after suffering a PEA arrest after choking on a big piece of steak.  She eventually had a return of spontaneous circulation roughly over 30 minutes.  __________ admitted the patient.  Chest x-ray on October 20 showed a perihilar heterogeneous airspace opacities __________ versus aspiration, likely aspiration.  EEG on October 21 showed severe diffuse cerebral dysfunction.  There is no improvement in neurologic status. The patient's baseline quality of life was poor to begin with.  She had severe myoclonus on physical examination, all indicating a very poor outcome neurologically, very fruitful and thoughtful discussions were had with the guardians of this patient for which they opted for comfort care and the patient expired.  FINAL DIAGNOSES UPON DEATH: 1. Severe anoxic brain injury, secondary to cardiac arrest, prolonged. 2. Cardiac arrest secondary to respiratory arrest. 3. Aspiration and upper airway occlusion by steak, leading to cardiac     arrest. 4. Baseline mental retardation.     Nelda Bucksaniel J Osiah Haring, MD     DJF/MEDQ  D:  04/18/2015  T:  04/19/2015  Job:  [id number]

## 2017-05-26 IMAGING — CR DG CHEST 1V PORT
1 series · 1 of 1 positions shown · non-contrast
Comparison: Chest radiograph performed 03/25/2015

CLINICAL DATA: Central line placement.  Initial encounter.

EXAM:
PORTABLE CHEST 1 VIEW

[AP]
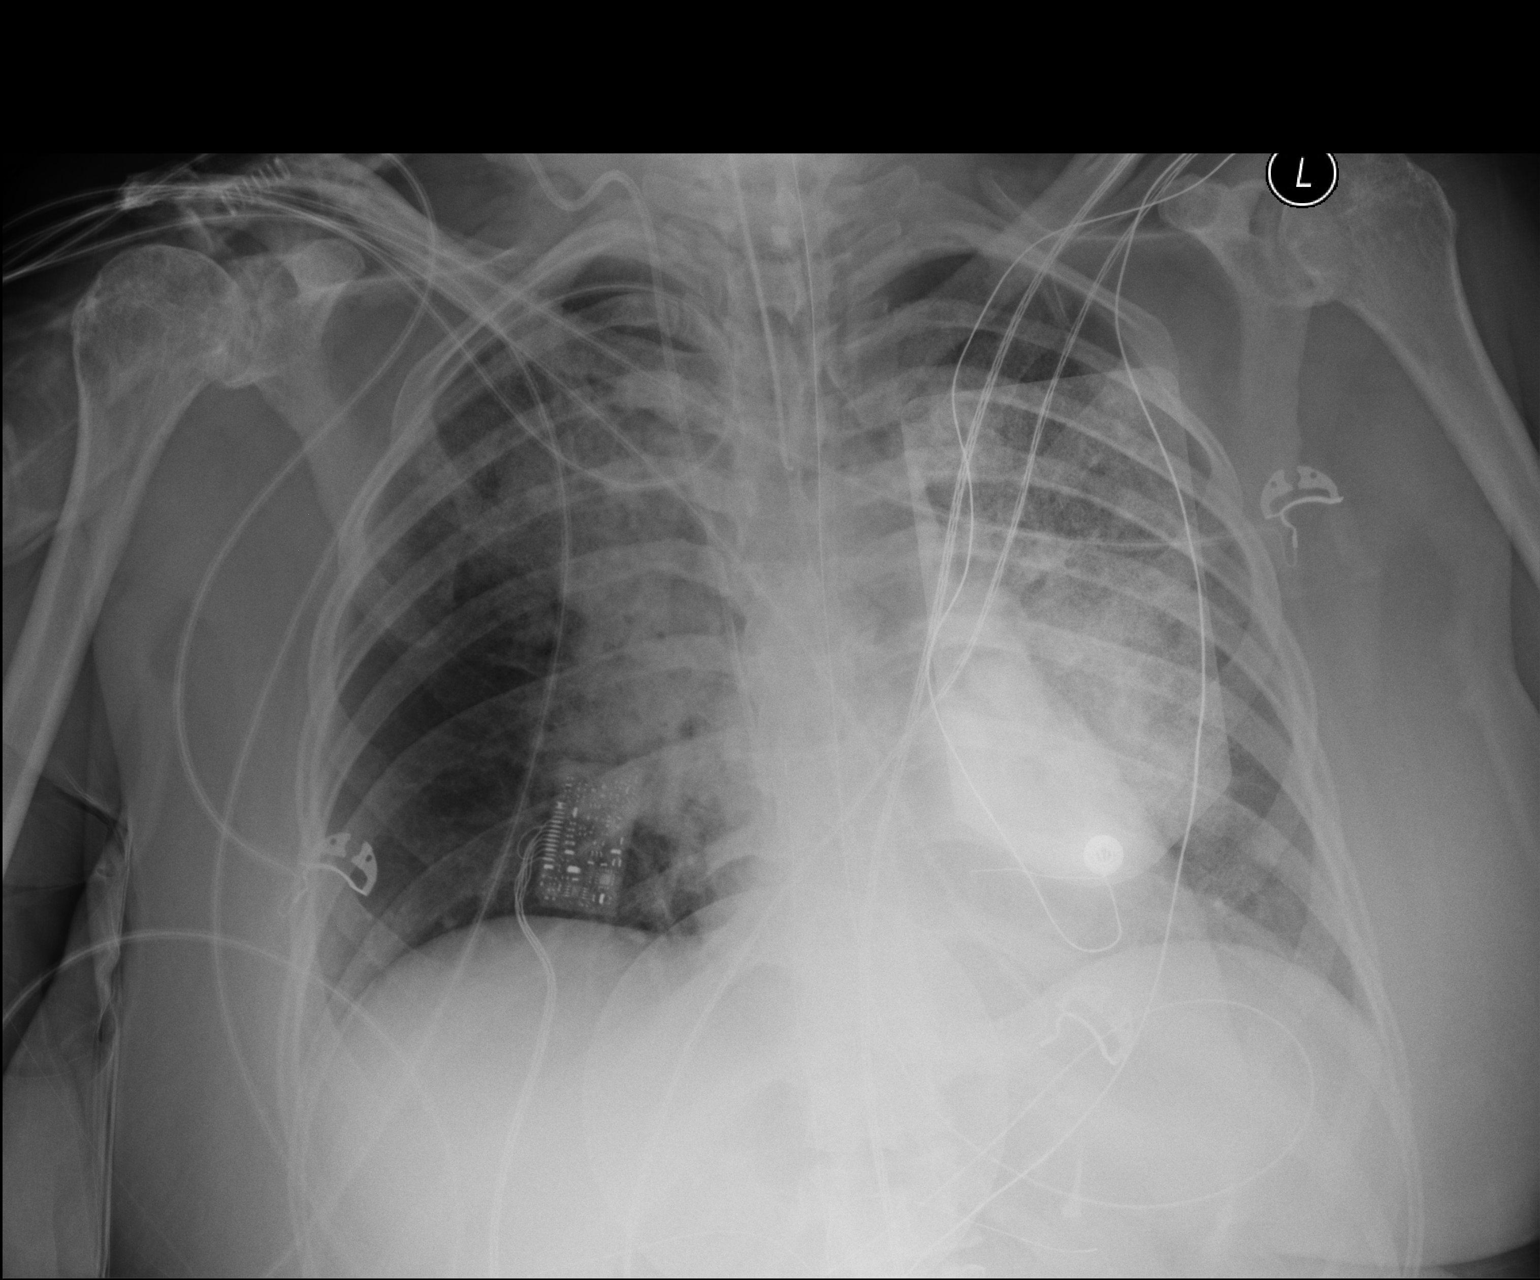

[1 of 1 positions shown; findings below may reference images not displayed]

FINDINGS: The patient's right IJ line is noted ending about the cavoatrial
junction.

The endotracheal tube is seen ending 2-3 cm above the carina. An
enteric tube is noted extending below the diaphragm.

Dense bilateral central airspace opacification is perhaps mildly
worsened from the prior study, concerning for multifocal pneumonia
or pulmonary edema. No pleural effusion or pneumothorax is seen.

The cardiomediastinal silhouette is borderline normal in size. No
acute osseous abnormalities are identified. Mild degenerative change
is noted at the right glenohumeral joint. An external pacing pad is
noted.
IMPRESSION: 1. Right IJ line noted ending about the cavoatrial junction.
2. Dense bilateral central airspace opacification is perhaps mildly
worsened from the prior study, concerning for multifocal pneumonia
or pulmonary edema.

## 2017-05-26 IMAGING — CR DG CHEST 1V PORT
1 series · 1 of 1 positions shown · non-contrast
Comparison: Chest radiograph from earlier today.

CLINICAL DATA: Respiratory failure

EXAM:
PORTABLE CHEST 1 VIEW

[AP]
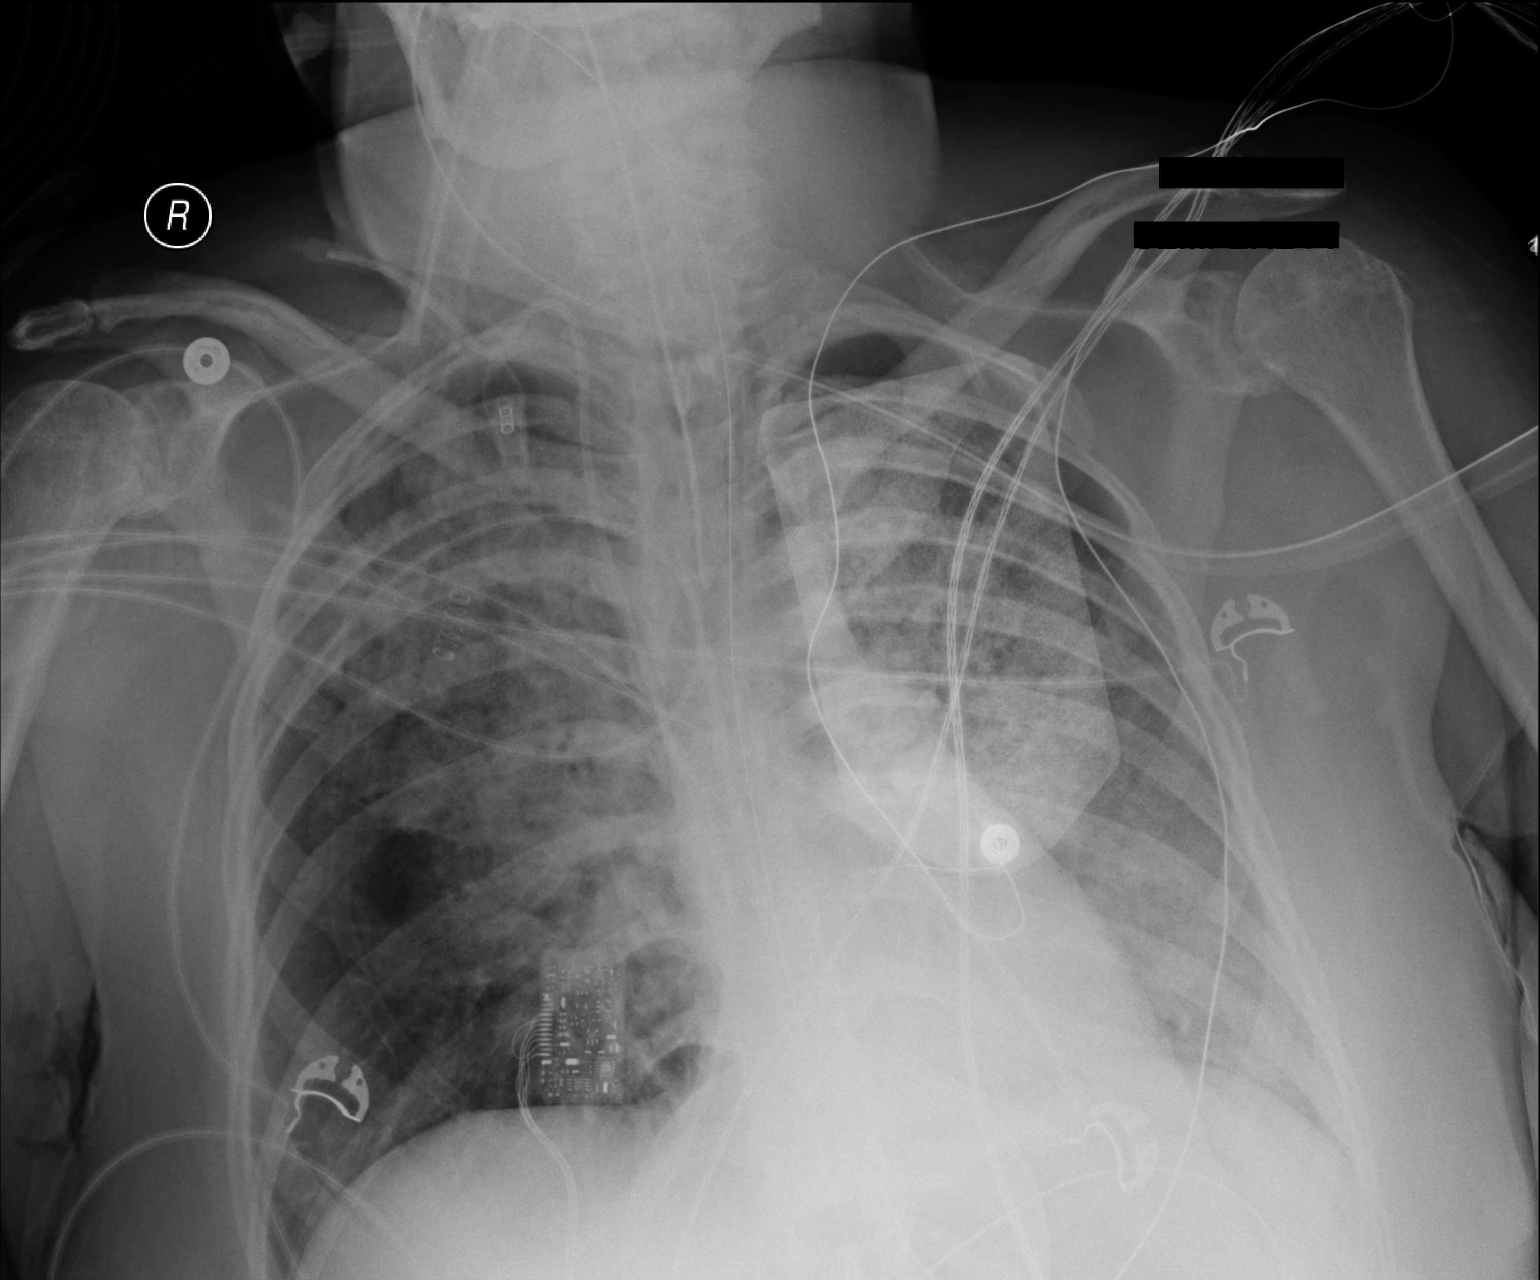

[1 of 1 positions shown; findings below may reference images not displayed]

FINDINGS: Endotracheal tube tip is 2.9 cm above the carina. Enteric tube
enters the stomach, with tip not seen on this image. Right internal
jugular central venous catheter terminates at the cavoatrial
junction. Pacer pad overlies the left upper chest. Stable
cardiomediastinal silhouette with normal heart size. No
pneumothorax. No pleural effusion. There is moderate to severe
pulmonary edema, slightly worsened.
IMPRESSION: Well-positioned lines and tubes. Slight worsening of moderate to
severe pulmonary edema.

## 2017-05-26 IMAGING — CT CT HEAD W/O CM
1 series · 16 of 30 positions shown, 20 images · non-contrast
Comparison: None.

CLINICAL DATA: Choked while eating knee yesterday. This resulted in
LOP. Anoxic encephalopathy.

EXAM:
CT HEAD WITHOUT CONTRAST
TECHNIQUE: Contiguous axial images were obtained from the base of the skull
through the vertex without intravenous contrast.

[Series 3: head 5.0 h30s · axial · 0.45mm/px · z∈[-140,-0]mm · 16 of 32 slices shown, 20 images]
[im 2/32  brain]
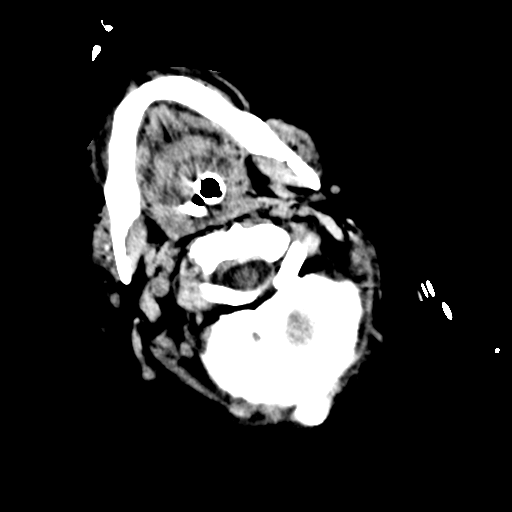
[im 2/32  bone]
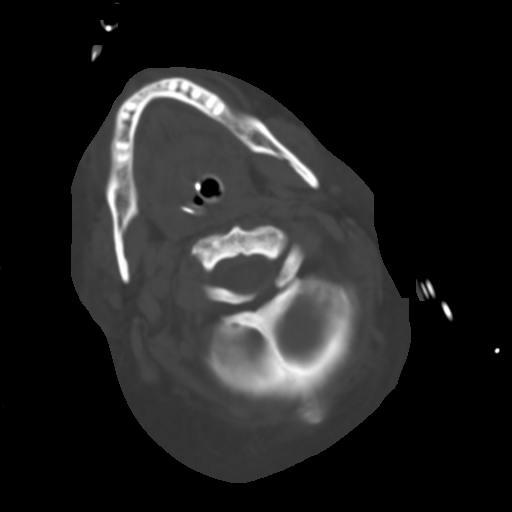
[im 4/32  brain]
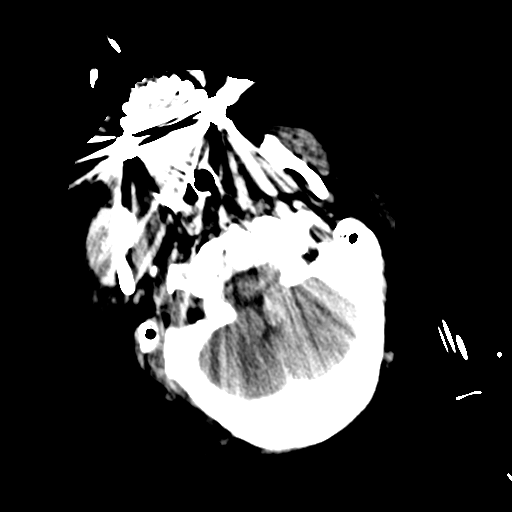
[im 6/32  brain]
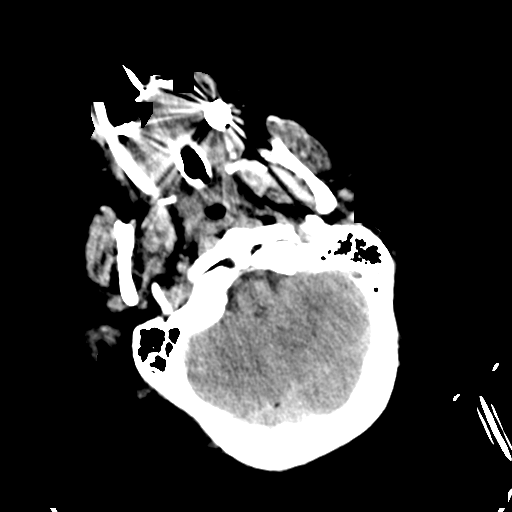
[im 8/32  brain]
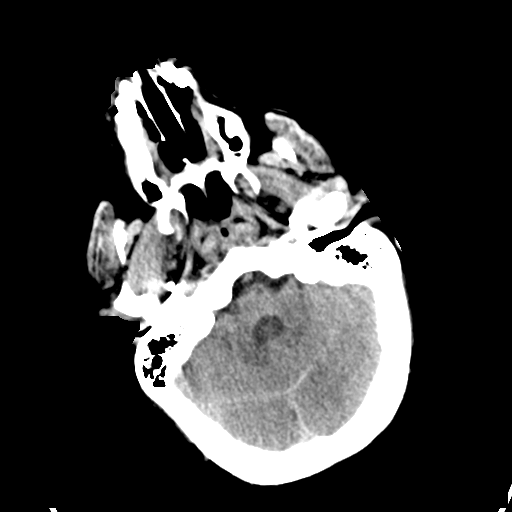
[im 9/32  brain]
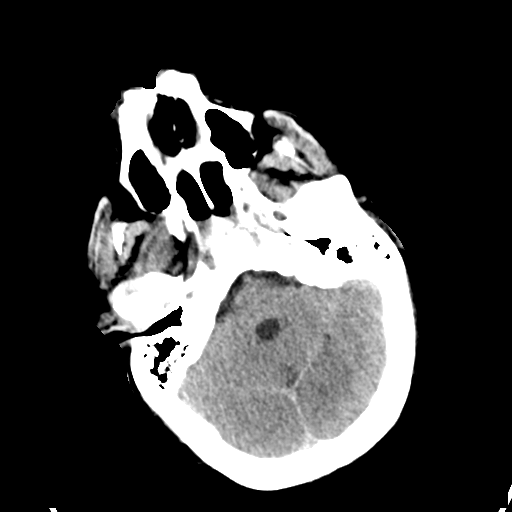
[im 9/32  bone]
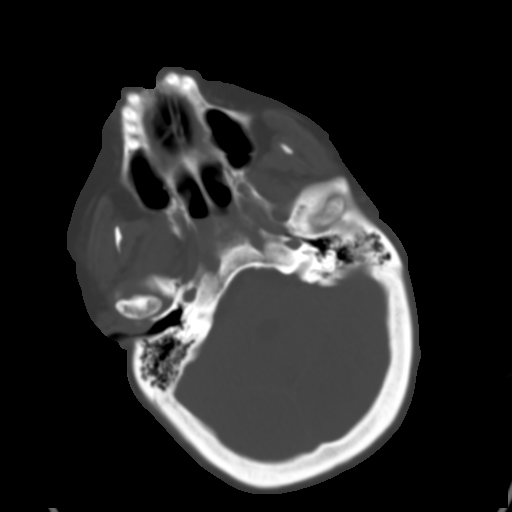
[im 11/32  brain]
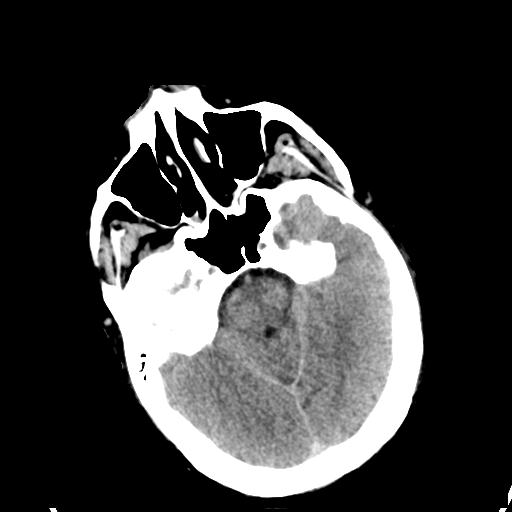
[im 13/32  brain]
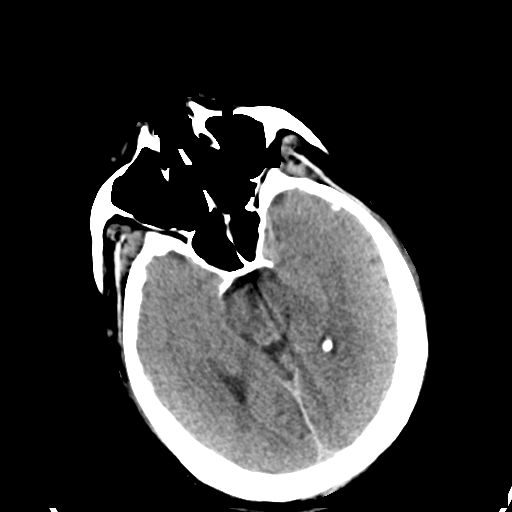
[im 15/32  brain]
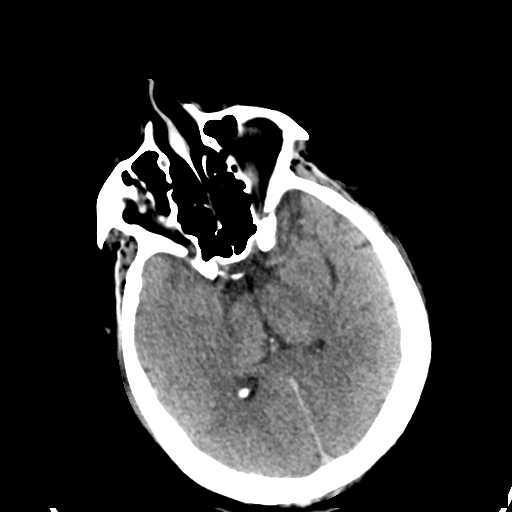
[im 17/32  brain]
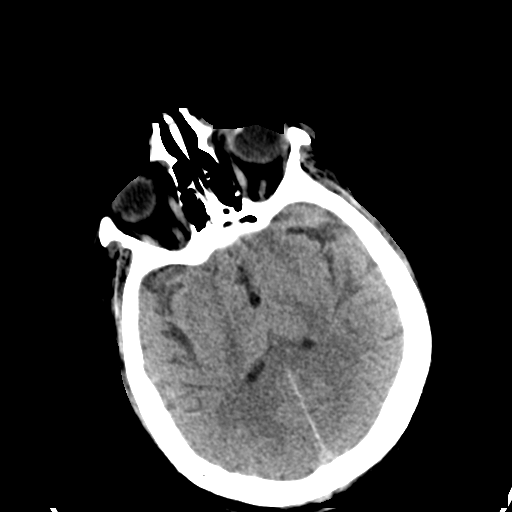
[im 17/32  bone]
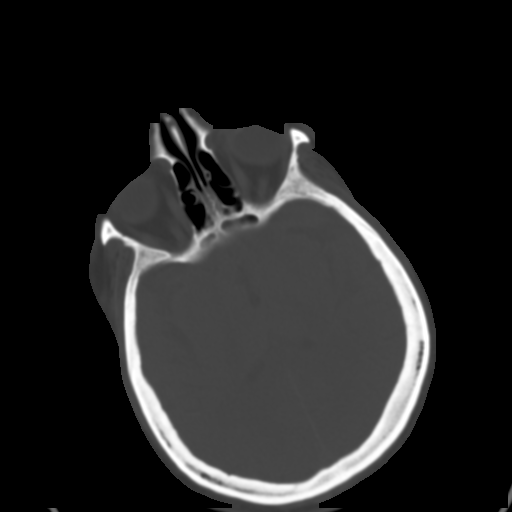
[im 19/32  brain]
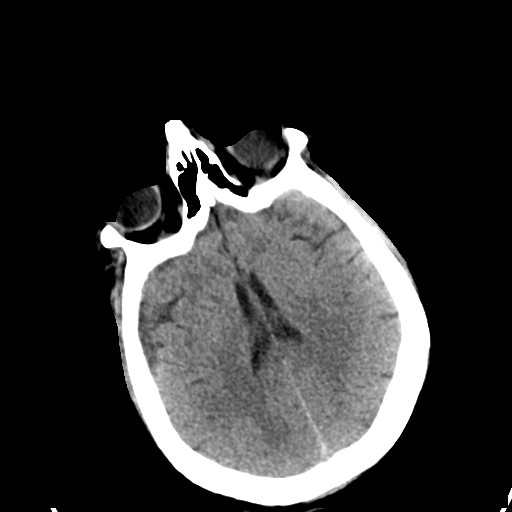
[im 21/32  brain]
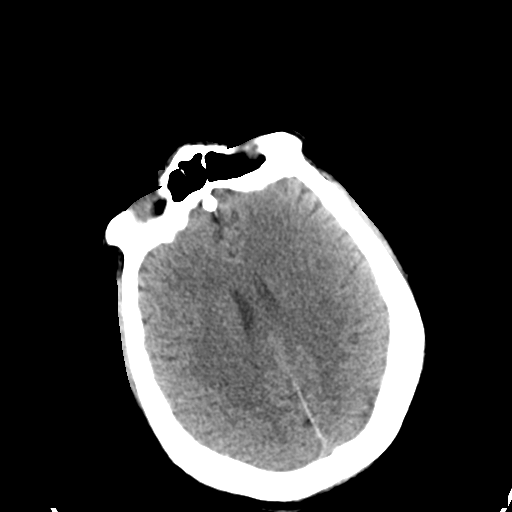
[im 23/32  brain]
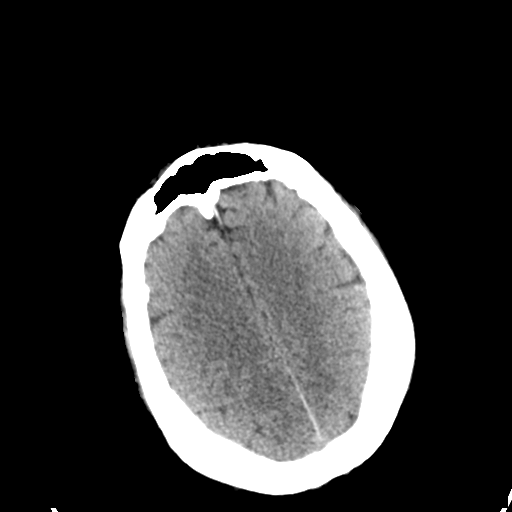
[im 24/32  brain]
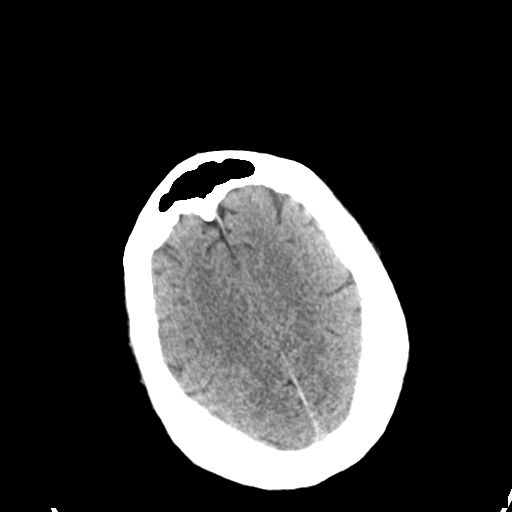
[im 24/32  bone]
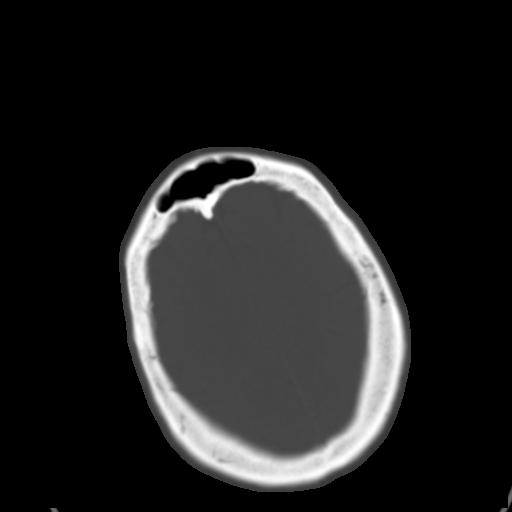
[im 26/32  brain]
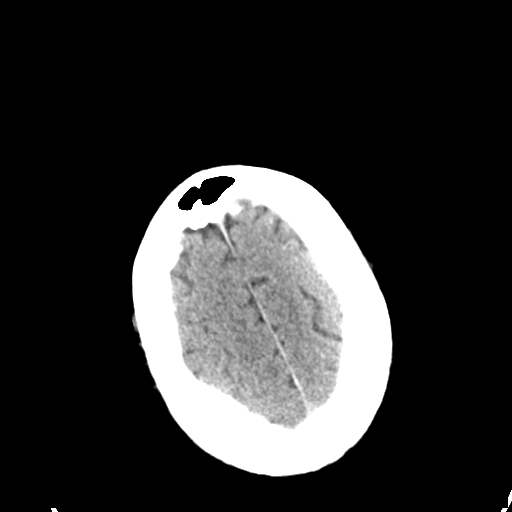
[im 28/32  brain]
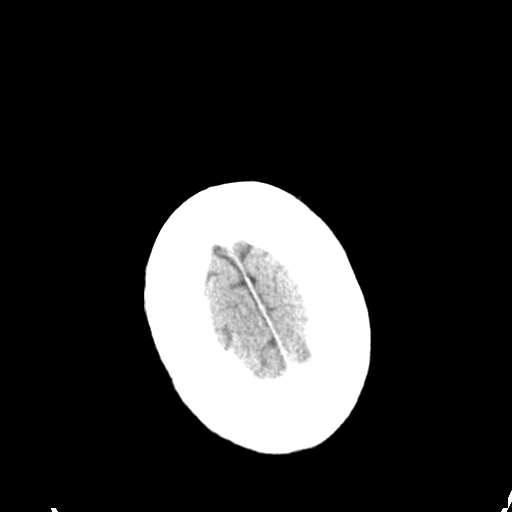
[im 30/32  brain]
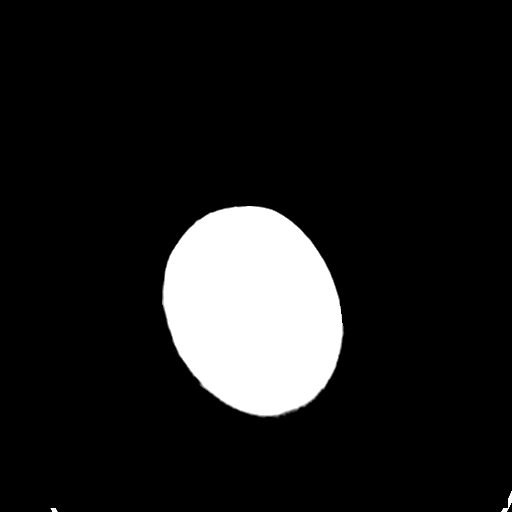

[16 of 30 positions shown; findings below may reference images not displayed]

FINDINGS: An endotracheal tube is in place. The ventricles are in the midline
without mass effect or shift. No extra-axial fluid collections are
identified. The gray-white differentiation is maintained. No
findings for diffuse cerebral edema or intracranial hemorrhage. No
findings for hemispheric infarction.

The bony calvarium is intact. No skull fracture. The paranasal
sinuses and mastoid air cells are grossly clear. The globes are
intact.
IMPRESSION: No acute intracranial findings.
